# Patient Record
Sex: Male | Born: 1950 | Race: White | Hispanic: No | Marital: Married | Smoking: Former smoker
Health system: Southern US, Community
[De-identification: ages and names within clinical notes are randomized; demographics above are authoritative.]

## PROBLEM LIST (undated history)

## (undated) DIAGNOSIS — E039 Hypothyroidism, unspecified: Secondary | ICD-10-CM

## (undated) DIAGNOSIS — C61 Malignant neoplasm of prostate: Secondary | ICD-10-CM

## (undated) DIAGNOSIS — F329 Major depressive disorder, single episode, unspecified: Secondary | ICD-10-CM

## (undated) DIAGNOSIS — F32A Depression, unspecified: Secondary | ICD-10-CM

## (undated) DIAGNOSIS — F319 Bipolar disorder, unspecified: Secondary | ICD-10-CM

## (undated) HISTORY — DX: Hypothyroidism, unspecified: E03.9

## (undated) HISTORY — DX: Bipolar disorder, unspecified: F31.9

## (undated) HISTORY — PX: APPENDECTOMY: SHX54

## (undated) HISTORY — DX: Malignant neoplasm of prostate: C61

## (undated) HISTORY — DX: Major depressive disorder, single episode, unspecified: F32.9

## (undated) HISTORY — DX: Depression, unspecified: F32.A

## (undated) HISTORY — PX: TONSILLECTOMY: SUR1361

---

## 2005-02-02 DIAGNOSIS — C61 Malignant neoplasm of prostate: Secondary | ICD-10-CM

## 2005-02-02 HISTORY — PX: PROSTATECTOMY: SHX69

## 2005-02-02 HISTORY — DX: Malignant neoplasm of prostate: C61

## 2006-12-13 ENCOUNTER — Ambulatory Visit: Payer: Self-pay | Admitting: Internal Medicine

## 2006-12-13 LAB — CONVERTED CEMR LAB
ALT: 25 units/L (ref 0–53)
AST: 22 units/L (ref 0–37)
Albumin: 3.8 g/dL (ref 3.5–5.2)
Basophils Absolute: 0 10*3/uL (ref 0.0–0.1)
Calcium: 8.5 mg/dL (ref 8.4–10.5)
Chloride: 109 meq/L (ref 96–112)
Cholesterol: 152 mg/dL (ref 0–200)
Eosinophils Absolute: 0.2 10*3/uL (ref 0.0–0.6)
Eosinophils Relative: 3.4 % (ref 0.0–5.0)
GFR calc non Af Amer: 106 mL/min
Ketones, ur: NEGATIVE mg/dL
LDL Cholesterol: 93 mg/dL (ref 0–99)
MCHC: 33.7 g/dL (ref 30.0–36.0)
MCV: 95.1 fL (ref 78.0–100.0)
Nitrite: NEGATIVE
Platelets: 190 10*3/uL (ref 150–400)
RBC: 4.66 M/uL (ref 4.22–5.81)
Total CHOL/HDL Ratio: 3.1
Urine Glucose: NEGATIVE mg/dL
VLDL: 9 mg/dL (ref 0–40)
WBC: 4.5 10*3/uL (ref 4.5–10.5)

## 2006-12-17 ENCOUNTER — Ambulatory Visit: Payer: Self-pay | Admitting: Internal Medicine

## 2006-12-17 DIAGNOSIS — F319 Bipolar disorder, unspecified: Secondary | ICD-10-CM | POA: Insufficient documentation

## 2006-12-17 DIAGNOSIS — Z8546 Personal history of malignant neoplasm of prostate: Secondary | ICD-10-CM | POA: Insufficient documentation

## 2006-12-17 DIAGNOSIS — J309 Allergic rhinitis, unspecified: Secondary | ICD-10-CM | POA: Insufficient documentation

## 2006-12-17 DIAGNOSIS — F329 Major depressive disorder, single episode, unspecified: Secondary | ICD-10-CM | POA: Insufficient documentation

## 2006-12-20 ENCOUNTER — Encounter: Payer: Self-pay | Admitting: Internal Medicine

## 2007-07-04 HISTORY — PX: PENILE PROSTHESIS IMPLANT: SHX240

## 2007-07-18 ENCOUNTER — Encounter: Payer: Self-pay | Admitting: Internal Medicine

## 2008-02-20 ENCOUNTER — Ambulatory Visit: Payer: Self-pay | Admitting: Internal Medicine

## 2008-02-20 LAB — CONVERTED CEMR LAB
ALT: 31 units/L (ref 0–53)
Basophils Absolute: 0.1 10*3/uL (ref 0.0–0.1)
Bilirubin Urine: NEGATIVE
Bilirubin, Direct: 0.1 mg/dL (ref 0.0–0.3)
CO2: 32 meq/L (ref 19–32)
Calcium: 9 mg/dL (ref 8.4–10.5)
Eosinophils Absolute: 0.2 10*3/uL (ref 0.0–0.7)
GFR calc Af Amer: 111 mL/min
GFR calc non Af Amer: 92 mL/min
HDL: 62.1 mg/dL (ref >39.0)
Hemoglobin: 15.5 g/dL (ref 13.0–17.0)
LDL Cholesterol: 104 mg/dL — ABNORMAL HIGH (ref 0–99)
Leukocytes, UA: NEGATIVE
Lymphocytes Relative: 33.3 % (ref 12.0–46.0)
MCHC: 34.8 g/dL (ref 30.0–36.0)
MCV: 94.8 fL (ref 78.0–100.0)
Neutro Abs: 2.2 10*3/uL (ref 1.4–7.7)
RDW: 13.2 % (ref 11.5–14.6)
Sodium: 143 meq/L (ref 135–145)
TSH: 6.54 microintl units/mL — ABNORMAL HIGH (ref 0.35–5.50)
Total Bilirubin: 1.1 mg/dL (ref 0.3–1.2)
Total CHOL/HDL Ratio: 2.9
Triglycerides: 65 mg/dL (ref 0–149)
Urobilinogen, UA: 1 (ref 0.0–1.0)
pH: 6 (ref 5.0–8.0)

## 2008-02-22 ENCOUNTER — Ambulatory Visit: Payer: Self-pay | Admitting: Internal Medicine

## 2008-02-22 DIAGNOSIS — E039 Hypothyroidism, unspecified: Secondary | ICD-10-CM | POA: Insufficient documentation

## 2008-02-22 DIAGNOSIS — J069 Acute upper respiratory infection, unspecified: Secondary | ICD-10-CM | POA: Insufficient documentation

## 2008-04-24 ENCOUNTER — Ambulatory Visit: Payer: Self-pay | Admitting: Internal Medicine

## 2008-04-24 DIAGNOSIS — J019 Acute sinusitis, unspecified: Secondary | ICD-10-CM | POA: Insufficient documentation

## 2008-05-07 ENCOUNTER — Telehealth (INDEPENDENT_AMBULATORY_CARE_PROVIDER_SITE_OTHER): Payer: Self-pay | Admitting: *Deleted

## 2008-05-18 ENCOUNTER — Ambulatory Visit: Payer: Self-pay | Admitting: Emergency Medicine

## 2008-06-06 ENCOUNTER — Ambulatory Visit: Payer: Self-pay | Admitting: Internal Medicine

## 2008-06-13 ENCOUNTER — Encounter: Payer: Self-pay | Admitting: Internal Medicine

## 2008-06-14 ENCOUNTER — Ambulatory Visit: Payer: Self-pay | Admitting: Internal Medicine

## 2008-06-15 ENCOUNTER — Ambulatory Visit: Payer: Self-pay | Admitting: Internal Medicine

## 2008-06-22 ENCOUNTER — Ambulatory Visit: Payer: Self-pay | Admitting: Internal Medicine

## 2008-06-25 ENCOUNTER — Ambulatory Visit: Payer: Self-pay | Admitting: Internal Medicine

## 2008-06-28 ENCOUNTER — Ambulatory Visit: Payer: Self-pay | Admitting: Internal Medicine

## 2008-07-03 ENCOUNTER — Ambulatory Visit: Payer: Self-pay | Admitting: Internal Medicine

## 2008-07-09 ENCOUNTER — Ambulatory Visit: Payer: Self-pay | Admitting: Internal Medicine

## 2008-07-10 ENCOUNTER — Ambulatory Visit: Payer: Self-pay | Admitting: Internal Medicine

## 2008-07-13 ENCOUNTER — Ambulatory Visit: Payer: Self-pay | Admitting: Internal Medicine

## 2008-07-16 ENCOUNTER — Ambulatory Visit: Payer: Self-pay | Admitting: Internal Medicine

## 2008-07-20 ENCOUNTER — Ambulatory Visit: Payer: Self-pay | Admitting: Internal Medicine

## 2008-07-23 ENCOUNTER — Ambulatory Visit: Payer: Self-pay | Admitting: Internal Medicine

## 2008-07-27 ENCOUNTER — Ambulatory Visit: Payer: Self-pay | Admitting: Internal Medicine

## 2008-07-30 ENCOUNTER — Ambulatory Visit: Payer: Self-pay | Admitting: Internal Medicine

## 2008-08-03 ENCOUNTER — Ambulatory Visit: Payer: Self-pay | Admitting: Internal Medicine

## 2008-08-07 ENCOUNTER — Ambulatory Visit: Payer: Self-pay | Admitting: Internal Medicine

## 2008-08-10 ENCOUNTER — Ambulatory Visit: Payer: Self-pay | Admitting: Internal Medicine

## 2008-08-13 ENCOUNTER — Ambulatory Visit: Payer: Self-pay | Admitting: Internal Medicine

## 2008-08-14 ENCOUNTER — Ambulatory Visit: Payer: Self-pay | Admitting: Internal Medicine

## 2008-08-17 ENCOUNTER — Ambulatory Visit: Payer: Self-pay | Admitting: Internal Medicine

## 2008-08-20 ENCOUNTER — Ambulatory Visit: Payer: Self-pay | Admitting: Internal Medicine

## 2008-08-24 ENCOUNTER — Ambulatory Visit: Payer: Self-pay | Admitting: Internal Medicine

## 2008-08-27 ENCOUNTER — Ambulatory Visit: Payer: Self-pay | Admitting: Internal Medicine

## 2008-08-28 ENCOUNTER — Ambulatory Visit: Payer: Self-pay | Admitting: Internal Medicine

## 2008-08-31 ENCOUNTER — Ambulatory Visit: Payer: Self-pay | Admitting: Internal Medicine

## 2008-09-07 ENCOUNTER — Ambulatory Visit: Payer: Self-pay | Admitting: Internal Medicine

## 2008-09-10 ENCOUNTER — Ambulatory Visit: Payer: Self-pay | Admitting: Internal Medicine

## 2008-09-11 ENCOUNTER — Ambulatory Visit: Payer: Self-pay | Admitting: Internal Medicine

## 2008-09-14 ENCOUNTER — Ambulatory Visit: Payer: Self-pay | Admitting: Internal Medicine

## 2008-09-17 ENCOUNTER — Ambulatory Visit: Payer: Self-pay | Admitting: Internal Medicine

## 2008-09-21 ENCOUNTER — Ambulatory Visit: Payer: Self-pay | Admitting: Internal Medicine

## 2008-09-24 ENCOUNTER — Ambulatory Visit: Payer: Self-pay | Admitting: Internal Medicine

## 2008-09-28 ENCOUNTER — Ambulatory Visit: Payer: Self-pay | Admitting: Internal Medicine

## 2008-10-01 ENCOUNTER — Ambulatory Visit: Payer: Self-pay | Admitting: Internal Medicine

## 2008-10-05 ENCOUNTER — Ambulatory Visit: Payer: Self-pay | Admitting: Internal Medicine

## 2008-10-11 ENCOUNTER — Ambulatory Visit: Payer: Self-pay | Admitting: Internal Medicine

## 2008-10-17 ENCOUNTER — Ambulatory Visit: Payer: Self-pay | Admitting: Internal Medicine

## 2008-10-22 ENCOUNTER — Telehealth: Payer: Self-pay | Admitting: Internal Medicine

## 2008-10-23 ENCOUNTER — Ambulatory Visit: Payer: Self-pay | Admitting: Internal Medicine

## 2008-10-23 DIAGNOSIS — R21 Rash and other nonspecific skin eruption: Secondary | ICD-10-CM

## 2008-10-30 ENCOUNTER — Ambulatory Visit: Payer: Self-pay | Admitting: Internal Medicine

## 2008-11-06 ENCOUNTER — Ambulatory Visit: Payer: Self-pay | Admitting: Internal Medicine

## 2008-11-13 ENCOUNTER — Ambulatory Visit: Payer: Self-pay | Admitting: Internal Medicine

## 2008-11-20 ENCOUNTER — Ambulatory Visit: Payer: Self-pay | Admitting: Internal Medicine

## 2008-11-27 ENCOUNTER — Ambulatory Visit: Payer: Self-pay | Admitting: Internal Medicine

## 2008-12-11 ENCOUNTER — Ambulatory Visit: Payer: Self-pay | Admitting: Internal Medicine

## 2008-12-18 ENCOUNTER — Ambulatory Visit: Payer: Self-pay | Admitting: Internal Medicine

## 2008-12-19 ENCOUNTER — Ambulatory Visit: Payer: Self-pay | Admitting: Internal Medicine

## 2008-12-28 ENCOUNTER — Ambulatory Visit: Payer: Self-pay | Admitting: Internal Medicine

## 2009-01-02 ENCOUNTER — Ambulatory Visit: Payer: Self-pay | Admitting: Internal Medicine

## 2009-01-08 ENCOUNTER — Ambulatory Visit: Payer: Self-pay | Admitting: Internal Medicine

## 2009-01-16 ENCOUNTER — Ambulatory Visit: Payer: Self-pay | Admitting: Internal Medicine

## 2009-01-22 ENCOUNTER — Ambulatory Visit: Payer: Self-pay | Admitting: Internal Medicine

## 2009-01-30 ENCOUNTER — Ambulatory Visit: Payer: Self-pay | Admitting: Internal Medicine

## 2009-02-07 ENCOUNTER — Ambulatory Visit: Payer: Self-pay | Admitting: Internal Medicine

## 2009-02-15 ENCOUNTER — Ambulatory Visit: Payer: Self-pay | Admitting: Internal Medicine

## 2009-02-16 LAB — CONVERTED CEMR LAB
ALT: 22 units/L (ref 0–53)
AST: 20 units/L (ref 0–37)
Albumin: 4.5 g/dL (ref 3.5–5.2)
Basophils Relative: 2.9 % (ref 0.0–3.0)
Bilirubin, Direct: 0.2 mg/dL (ref 0.0–0.3)
CO2: 18 meq/L — ABNORMAL LOW (ref 19–32)
Calcium: 8.2 mg/dL — ABNORMAL LOW (ref 8.4–10.5)
Cholesterol: 145 mg/dL (ref 0–200)
Eosinophils Relative: 5.4 % — ABNORMAL HIGH (ref 0.0–5.0)
Glucose, Bld: 87 mg/dL (ref 70–99)
HCT: 47 % (ref 39.0–52.0)
HDL: 49 mg/dL (ref >39)
Hemoglobin: 15.4 g/dL (ref 13.0–17.0)
Lymphocytes Relative: 36.1 % (ref 12.0–46.0)
Lymphs Abs: 1.8 10*3/uL (ref 0.7–4.0)
Monocytes Relative: 11.6 % (ref 3.0–12.0)
Neutro Abs: 2.2 10*3/uL (ref 1.4–7.7)
Nitrite: NEGATIVE
Potassium: 4 meq/L (ref 3.5–5.3)
RBC: 4.75 M/uL (ref 4.22–5.81)
Sodium: 145 meq/L (ref 135–145)
Specific Gravity, Urine: 1.025 (ref 1.000–1.030)
Total CHOL/HDL Ratio: 3
Total Protein, Urine: NEGATIVE mg/dL
Urine Glucose: NEGATIVE mg/dL
Urobilinogen, UA: 0.2 (ref 0.0–1.0)
VLDL: 22 mg/dL (ref 0–40)
WBC: 5 10*3/uL (ref 4.5–10.5)

## 2009-02-19 ENCOUNTER — Ambulatory Visit: Payer: Self-pay | Admitting: Internal Medicine

## 2009-02-22 ENCOUNTER — Ambulatory Visit: Payer: Self-pay | Admitting: Internal Medicine

## 2009-02-28 ENCOUNTER — Ambulatory Visit: Payer: Self-pay | Admitting: Internal Medicine

## 2009-03-05 ENCOUNTER — Encounter: Payer: Self-pay | Admitting: Internal Medicine

## 2009-03-08 ENCOUNTER — Ambulatory Visit: Payer: Self-pay | Admitting: Internal Medicine

## 2009-03-15 ENCOUNTER — Ambulatory Visit: Payer: Self-pay | Admitting: Internal Medicine

## 2009-03-22 ENCOUNTER — Ambulatory Visit: Payer: Self-pay | Admitting: Internal Medicine

## 2009-03-29 ENCOUNTER — Ambulatory Visit: Payer: Self-pay | Admitting: Internal Medicine

## 2009-04-01 ENCOUNTER — Telehealth: Payer: Self-pay | Admitting: Internal Medicine

## 2009-04-05 ENCOUNTER — Ambulatory Visit: Payer: Self-pay | Admitting: Internal Medicine

## 2009-04-11 ENCOUNTER — Ambulatory Visit: Payer: Self-pay | Admitting: Internal Medicine

## 2009-04-12 ENCOUNTER — Ambulatory Visit: Payer: Self-pay | Admitting: Internal Medicine

## 2009-04-19 ENCOUNTER — Ambulatory Visit: Payer: Self-pay | Admitting: Internal Medicine

## 2009-04-25 ENCOUNTER — Ambulatory Visit: Payer: Self-pay | Admitting: Internal Medicine

## 2009-05-03 ENCOUNTER — Ambulatory Visit: Payer: Self-pay | Admitting: Internal Medicine

## 2009-05-10 ENCOUNTER — Ambulatory Visit: Payer: Self-pay | Admitting: Internal Medicine

## 2009-05-17 ENCOUNTER — Ambulatory Visit: Payer: Self-pay | Admitting: Internal Medicine

## 2009-05-23 ENCOUNTER — Ambulatory Visit: Payer: Self-pay | Admitting: Internal Medicine

## 2009-05-31 ENCOUNTER — Ambulatory Visit: Payer: Self-pay | Admitting: Internal Medicine

## 2009-06-14 ENCOUNTER — Ambulatory Visit: Payer: Self-pay | Admitting: Internal Medicine

## 2009-06-21 ENCOUNTER — Ambulatory Visit: Payer: Self-pay | Admitting: Internal Medicine

## 2009-06-27 ENCOUNTER — Ambulatory Visit: Payer: Self-pay | Admitting: Internal Medicine

## 2009-07-05 ENCOUNTER — Ambulatory Visit: Payer: Self-pay | Admitting: Internal Medicine

## 2009-07-12 ENCOUNTER — Ambulatory Visit: Payer: Self-pay | Admitting: Internal Medicine

## 2009-07-19 ENCOUNTER — Ambulatory Visit: Payer: Self-pay | Admitting: Internal Medicine

## 2009-07-26 ENCOUNTER — Ambulatory Visit: Payer: Self-pay | Admitting: Internal Medicine

## 2009-08-02 ENCOUNTER — Ambulatory Visit: Payer: Self-pay | Admitting: Internal Medicine

## 2009-08-09 ENCOUNTER — Ambulatory Visit: Payer: Self-pay | Admitting: Internal Medicine

## 2009-08-16 ENCOUNTER — Ambulatory Visit: Payer: Self-pay | Admitting: Internal Medicine

## 2009-08-23 ENCOUNTER — Ambulatory Visit: Payer: Self-pay | Admitting: Internal Medicine

## 2009-08-28 ENCOUNTER — Telehealth: Payer: Self-pay | Admitting: Internal Medicine

## 2009-08-30 ENCOUNTER — Ambulatory Visit: Payer: Self-pay | Admitting: Internal Medicine

## 2009-09-06 ENCOUNTER — Ambulatory Visit: Payer: Self-pay | Admitting: Internal Medicine

## 2009-09-10 ENCOUNTER — Ambulatory Visit: Payer: Self-pay | Admitting: Internal Medicine

## 2009-09-13 ENCOUNTER — Ambulatory Visit: Payer: Self-pay | Admitting: Internal Medicine

## 2009-09-20 ENCOUNTER — Ambulatory Visit: Payer: Self-pay | Admitting: Internal Medicine

## 2009-09-27 ENCOUNTER — Ambulatory Visit: Payer: Self-pay | Admitting: Internal Medicine

## 2009-10-04 ENCOUNTER — Ambulatory Visit: Payer: Self-pay | Admitting: Internal Medicine

## 2009-10-11 ENCOUNTER — Ambulatory Visit: Payer: Self-pay | Admitting: Internal Medicine

## 2009-10-18 ENCOUNTER — Ambulatory Visit: Payer: Self-pay | Admitting: Internal Medicine

## 2009-10-25 ENCOUNTER — Ambulatory Visit: Payer: Self-pay | Admitting: Internal Medicine

## 2009-11-01 ENCOUNTER — Ambulatory Visit: Payer: Self-pay | Admitting: Internal Medicine

## 2009-11-08 ENCOUNTER — Ambulatory Visit: Payer: Self-pay | Admitting: Internal Medicine

## 2009-11-15 ENCOUNTER — Ambulatory Visit: Payer: Self-pay | Admitting: Internal Medicine

## 2009-11-22 ENCOUNTER — Ambulatory Visit: Payer: Self-pay | Admitting: Internal Medicine

## 2009-11-29 ENCOUNTER — Ambulatory Visit: Payer: Self-pay | Admitting: Internal Medicine

## 2009-12-06 ENCOUNTER — Ambulatory Visit: Payer: Self-pay | Admitting: Internal Medicine

## 2009-12-13 ENCOUNTER — Ambulatory Visit: Payer: Self-pay | Admitting: Internal Medicine

## 2009-12-20 ENCOUNTER — Ambulatory Visit: Payer: Self-pay | Admitting: Internal Medicine

## 2009-12-27 ENCOUNTER — Ambulatory Visit: Payer: Self-pay | Admitting: Internal Medicine

## 2010-01-02 ENCOUNTER — Ambulatory Visit: Payer: Self-pay | Admitting: Internal Medicine

## 2010-01-10 ENCOUNTER — Ambulatory Visit: Payer: Self-pay | Admitting: Internal Medicine

## 2010-01-17 ENCOUNTER — Ambulatory Visit: Payer: Self-pay | Admitting: Internal Medicine

## 2010-01-28 ENCOUNTER — Ambulatory Visit: Payer: Self-pay | Admitting: Internal Medicine

## 2010-01-31 ENCOUNTER — Ambulatory Visit: Payer: Self-pay | Admitting: Internal Medicine

## 2010-02-04 ENCOUNTER — Ambulatory Visit: Payer: Self-pay | Admitting: Internal Medicine

## 2010-02-17 ENCOUNTER — Ambulatory Visit: Payer: Self-pay | Admitting: Internal Medicine

## 2010-02-21 ENCOUNTER — Ambulatory Visit: Payer: Self-pay | Admitting: Internal Medicine

## 2010-02-27 ENCOUNTER — Ambulatory Visit: Payer: Self-pay | Admitting: Internal Medicine

## 2010-03-02 ENCOUNTER — Ambulatory Visit: Payer: Self-pay | Admitting: Internal Medicine

## 2010-03-06 NOTE — Assessment & Plan Note (Signed)
Summary: Thomas Carroll   Primary Aireona Torelli/Referring Casin Federici:  Dr. Jonny Ruiz  CC:  Follow up visit-allergies.  History of Present Illness: 08/28/08- Allergic rhinitis Scheduled return midway through allergy vaccine build up based on skin testing last visit. So far no problems or concerns with the shots. Expects some head congestion with Fall leaves. We discussed decongestants and distinguished from antihistamines. He denies headache, prulent or bloody discharge, chest tightness or wheeze. Discussed and recommended flu vax in season, and pneumovax now, since he has never had either and will be going to [place] in October.  February 28, 2009- Allergic rhinitis Good trip to [place] without respiratory problems on that trip. Recognizes effect of dust exposure in his home with mild persistent nasal congestion. He says he is much better since taking allergy shots. Now at maintenance. Gets allergy injections here at 1:50 with no problems.  September 13, 2009- Allergic rhinitis Worst season is household dust/ winter. He feels overall he is doing much better now thanks to allergy vaccine- Now at 1:10. He has the information on dust mite control. He gets some nasal congestion and drainage, but much less than before    Preventive Screening-Counseling & Management  Alcohol-Tobacco     Smoking Status: quit     Year Quit: 04-09-1981  Current Medications (verified): 1)  Lamictal 100 Mg  Tabs (Lamotrigine) .Marland Kitchen.. 1 and 1/2 Tab By Mouth Qam and 2 By Mouth Qhs 2)  Trazodone Hcl 100 Mg  Tabs (Trazodone Hcl) .... Take 2 By Mouth Q Pm 3)  Levothyroxine Sodium 25 Mcg Tabs (Levothyroxine Sodium) .Marland Kitchen.. 1po Once Daily 4)  Adult Aspirin Ec Low Strength 81 Mg Tbec (Aspirin) .Marland Kitchen.. 1po Once Daily 5)  Allergy Vaccine Gh .... Advancing Now To 1:10  Allergies (verified): No Known Drug Allergies  Past History:  Past Medical History: Last updated: 06/06/2008 Prostate cancer, hx of - 2007  Depression Allergic rhinitis - Skin test POS  06/06/08  Bipolar Hypothyroidism  Past Surgical History: Last updated: 02/22/2008 s/p prostatectomy 2007 Tonsillectomy Appendectomy s/p penile implant 6/09 - [facility] - [facility]rology  Family History: Last updated: 06/06/2008 Family History Lung cancer - father deceased Mother with liver cancer deceased father with probable bipolar  Social History: Last updated: 02/22/2008 Former Smoker Alcohol use-yes Divorced 1 son - 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    Vital Signs:  Patient profile:   60 year old male Height:      71 inches Weight:      190.13 pounds BMI:     26.61 O2 Sat:      95 % on Room air Pulse rate:   86 / minute BP sitting:   122 / 78  (right arm) Cuff size:   regular  Vitals Entered By: Reynaldo Minium CMA (September 13, 2009 11:28 AM)  O2 Flow:  Room air CC: Follow up visit-allergies   Physical Exam  Additional Exam:  General: A/Ox3; pleasant and cooperative, NAD, very comfortable appearing SKIN: no rash, lesions NODES: no lymphadenopathy HEENT: [place]/AT, EOM- WNL, Conjuctivae- clear, PERRLA, periorbital edema, mild conjunctival injection, TM-WNL, Nose- clear, Throat- red, no  stridor, voice normal, Mallampti III NECK: Supple w/ fair ROM, JVD- none, normal carotid impulses w/o bruits CHEST: Clear to P&A HEART: RRR, no m/g/r heard ABDOMEN: scaphoid JXB:JYNW, nl pulses, no edema  NEURO: Grossly intact to observation      Impression & Recommendations:  Problem # 1:  ALLERGIC  RHINITIS (ICD-477.9)  Doing verywell and satisfied at this point with allergy vaccine. He has been educated on dust control mneasures. He knows to take antihistmamines if needed.l  Other Orders: Est. Patient Level III (29562)  Patient Instructions: 1)  Please schedule a follow-up appointment in 1 year. 2)  Continue allergy vaccine 3)  Call sooner if needed

## 2010-03-06 NOTE — Progress Notes (Signed)
Summary: ABX refill?  Phone Note Call from Patient Call back at [facility] Phone [phone]   Caller: Patient Summary of Call: pt called requesting refill of ABX stating that swollen glands in neck have returned. I advised pt that he would need an OV but he requested I ask MD stating that this was something that was discussed at OV (possible return of infection) and that he was here only 1 month ago. please advise Initial call taken by: Margaret Pyle, CMA,  April 01, 2009 11:44 AM  Follow-up for Phone Call        this is a new problem, and per office policy we do not simply call in antibx;  consider OV or try mucinex otc for congesiton, tylenol for pain  Follow-up by: Corwin Levins MD,  April 01, 2009 12:12 PM  Additional Follow-up for Phone Call Additional follow up Details #1::        I called pt to inform of above and was interupted by pt who stated " I get a sinus infection 4-5 a winter, so If I need an antibiotic I need to come back in for the same F---ing thing!" The pt then hung up. Additional Follow-up by: Margaret Pyle, CMA,  April 01, 2009 1:30 PM

## 2010-03-06 NOTE — Miscellaneous (Signed)
Summary: Injection Orders / [facility]    Injection Orders / [facility]    Imported By: Lennie Odor 07/02/2009 14:31:50  _____________________________________________________________________  External Attachment:    Type:   Image     Comment:   External Document

## 2010-03-06 NOTE — Miscellaneous (Signed)
Summary: Injection Record/[facility]  Injection Record/[facility]   Imported By: Sherian Rein 07/25/2009 08:16:32  _____________________________________________________________________  External Attachment:    Type:   Image     Comment:   External Document

## 2010-03-06 NOTE — Miscellaneous (Signed)
Summary: Injection Record / [facility]    Injection Record / [facility]    Imported By: Lennie Odor 10/04/2009 09:55:11  _____________________________________________________________________  External Attachment:    Type:   Image     Comment:   External Document

## 2010-03-06 NOTE — Miscellaneous (Signed)
Summary: Injection record  Injection record   Imported By: Lester Mohall 02/14/2010 12:15:40  _____________________________________________________________________  External Attachment:    Type:   Image     Comment:   External Document

## 2010-03-06 NOTE — Miscellaneous (Signed)
Summary: Injection Record/[facility]  Injection Record/[facility]   Imported By: Sherian Rein 06/25/2009 08:46:58  _____________________________________________________________________  External Attachment:    Type:   Image     Comment:   External Document

## 2010-03-06 NOTE — Progress Notes (Signed)
Summary: nos appt  Phone Note Call from Patient   Caller: [email or web address]  Call For: Dicky Boer Summary of Call: Rsc nos from 7/26 to 7/29 @ 1:30p. Initial call taken by: Darletta Moll,  August 28, 2009 9:53 AM

## 2010-03-06 NOTE — Assessment & Plan Note (Signed)
Summary: 1 mos f/u #/cd   Vital Signs:  Patient profile:   60 year old male Height:      70 inches Weight:      191 pounds BMI:     27.50 O2 Sat:      95 % on Room air Temp:     98.1 degrees F oral Pulse rate:   90 / minute BP sitting:   122 / 86  (left arm) Cuff size:   regular  Vitals Entered ByZella Ball Ewing (February 19, 2009 2:00 PM)  O2 Flow:  Room air  CC: yearly/RE   Primary Care Provider:  Dr. Jonny Ruiz  CC:  yearly/RE.  History of Present Illness: overall doing well;  incidently with 2 days onset severe ST iwth fever, but no cough, wheezing and Pt denies CP, sob, doe, wheezing, orthopnea, pnd, worsening LE edema, palps, dizziness or syncope   Problems Prior to Update: 1)  Pharyngitis-acute  (ICD-462) 2)  Sinusitis- Acute-nos  (ICD-461.9) 3)  Rash-nonvesicular  (ICD-782.1) 4)  Sinusitis- Acute-nos  (ICD-461.9) 5)  Hypothyroidism  (ICD-244.9) 6)  Uri  (ICD-465.9) 7)  Preventive Health Care  (ICD-V70.0) 8)  Preventive Health Care  (ICD-V70.0) 9)  Bipolar Affective Disorder  (ICD-296.80) 10)  Allergic Rhinitis  (ICD-477.9) 11)  Depression  (ICD-311) 12)  Prostate Cancer, Hx of  (ICD-V10.46) 13)  Special Screening Malignant Neoplasm of Prostate  (ICD-V76.44) 14)  Routine General Medical Exam@health  Care Fac[facility]  (ICD-V70.0)  Medications Prior to Update: 1)  Lamictal 100 Mg  Tabs (Lamotrigine) .Marland Kitchen.. 1 and 1/2 Tab By Mouth Qam and 2 By Mouth Qhs 2)  Trazodone Hcl 100 Mg  Tabs (Trazodone Hcl) .... Take 2 By Mouth Q Pm 3)  Levothyroxine Sodium 25 Mcg Tabs (Levothyroxine Sodium) .Marland Kitchen.. 1po Once Daily 4)  Adult Aspirin Ec Low Strength 81 Mg Tbec (Aspirin) .Marland Kitchen.. 1po Once Daily 5)  Allergy Vaccine .... As Directed 6)  Prednisone 10 Mg Tabs (Prednisone) .... 3po Qd For 3days, Then 2po Qd For 3days, Then 1po Qd For 3days, Then Stop 7)  Triamcinolone Acetonide 0.5 % Crea (Triamcinolone Acetonide) .... Use Asd Two Times A Day As Needed 8)  Clarithromycin 500 Mg Tabs (Clarithromycin)  .Marland Kitchen.. 1 By Mouth Two Times A Day 9)  Hydrocodone-Homatropine 5-1.5 Mg/62ml Syrp (Hydrocodone-Homatropine) .Marland Kitchen.. 1 Tsp By Mouth  Q 6 Hrs As Needed  Current Medications (verified): 1)  Lamictal 100 Mg  Tabs (Lamotrigine) .Marland Kitchen.. 1 and 1/2 Tab By Mouth Qam and 2 By Mouth Qhs 2)  Trazodone Hcl 100 Mg  Tabs (Trazodone Hcl) .... Take 2 By Mouth Q Pm 3)  Levothyroxine Sodium 25 Mcg Tabs (Levothyroxine Sodium) .Marland Kitchen.. 1po Once Daily 4)  Adult Aspirin Ec Low Strength 81 Mg Tbec (Aspirin) .Marland Kitchen.. 1po Once Daily 5)  Allergy Vaccine .... As Directed 6)  Clarithromycin 500 Mg Tabs (Clarithromycin) .Marland Kitchen.. 1 By Mouth Two Times A Day  Allergies (verified): No Known Drug Allergies  Past History:  Past Medical History: Last updated: 06/06/2008 Prostate cancer, hx of - 2007  Depression Allergic rhinitis - Skin test POS 06/06/08  Bipolar Hypothyroidism  Past Surgical History: Last updated: 02/22/2008 s/p prostatectomy 2007 Tonsillectomy Appendectomy s/p penile implant 6/09 - [facility] - urology  Family History: Last updated: 06/06/2008 Family History Lung cancer - father deceased Mother with liver cancer deceased father with probable bipolar  Social History: Last updated: 02/22/2008 Former Smoker Alcohol use-yes Divorced 1 son - 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         all otherwise negative per pt  Physical Exam  General:  alert and well-developed.   Head:   normocephalic and atraumatic.   Eyes:  vision grossly intact, pupils equal, and pupils round.   Ears:  R ear normal and L ear normal.   Nose:  no external deformity and no nasal discharge.   Mouth:  pharyngeal erythema and pharyngeal exudate.   Neck:  supple and cervical lymphadenopathy.   Lungs:  normal respiratory effort and normal breath sounds.   Heart:  normal rate and regular rhythm.   Abdomen:  soft, non-tender, and normal bowel sounds.   Msk:  no joint tenderness and no joint swelling.   Extremities:  no edema, no erythema  Neurologic:  cranial nerves II-XII intact and strength normal in all extremities.     Impression & Recommendations:  Problem # 1:  Preventive Health Care (ICD-V70.0) Overall doing well, updated the age appropriate counseling and education;  routine health screening/prevention reviewed and done as appropriate unless declined, immunizations up to date or declined, diet counseling done if overweight, urged to quit smoking if smokes , most recent labs reviewed and current ordered if appropriate, ecg reviewed or declined  Orders: EKG w/ Interpretation (93000)  Problem # 2:  PHARYNGITIS-ACUTE (ICD-462)  His updated medication list for this problem includes:    Adult Aspirin Ec Low Strength 81 Mg Tbec (Aspirin) .Marland Kitchen... 1po once daily    Clarithromycin 500 Mg Tabs (Clarithromycin) .Marland Kitchen... 1 by mouth two times a day treat as above, f/u any worsening signs or symptoms   Complete Medication List: 1)  Lamictal 100 Mg Tabs (Lamotrigine) .Marland Kitchen.. 1 and 1/2 tab by mouth qam and 2 by mouth qhs 2)  Trazodone Hcl 100 Mg Tabs (Trazodone hcl) .... Take 2 by mouth q pm 3)  Levothyroxine Sodium 25 Mcg Tabs (Levothyroxine sodium) .Marland Kitchen.. 1po once daily 4)  Adult Aspirin Ec Low Strength 81 Mg Tbec (Aspirin) .Marland Kitchen.. 1po once daily 5)  Allergy Vaccine  .... As directed 6)  Clarithromycin 500 Mg Tabs (Clarithromycin) .Marland Kitchen.. 1 by mouth two times a day  Patient Instructions: 1)  Please take all  new medications as prescribed 2)  Continue all previous medications as before this visit  3)  Please schedule a follow-up appointment in 1 year or sooner if needed for Yearly Exam Prescriptions: CLARITHROMYCIN 500 MG TABS (CLARITHROMYCIN) 1 by mouth two times a day  #20 x 0   Entered and Authorized by:   Corwin Levins MD   Signed by:   Corwin Levins MD on 02/19/2009   Method used:   Electronically to        [facility] [address] (retail)       [address]       [place], [place]  [address]       Ph: [phone] or [phone]       Fax: [phone]   RxID:   [id number]

## 2010-03-06 NOTE — Assessment & Plan Note (Signed)
Summary: 6 months/ mbw   Primary Provider/Referring Provider:  Dr. Jonny Ruiz  CC:  6 month follow up   and pt states allergies are better but still has post nasal drainage .  History of Present Illness:  06/06/08- Allergic rhinitis Referred by Dr Delton Coombes specifically for allergy testing. Patient with hx positive inhalant allergies/ skin testing/ vaccine by Dr Stevphen [place] Onset around age 60. Doing better around age 75 and went off the shots. He says vaccine helped with no problems. Has been off allergy vaccine for years. Rhinits without asthma gradually returning  Now almost perennial, a little milder in the winter. The past year has been much worse- head congestion and pressure, watery rhinorhea, sneezing. Little or no asthma/ wheeze/ chest. Not aware of problems with foods, insect stings or aspirin and no hx nasal polyps.  Skin test:Pos Especially dust/ mite; cockroach.  08/28/08- Allergic rhinitis Scheduled return midway through allergy vaccine build up based on skin testing last visit. So far no problems or concerns with the shots. Expects some head congestion with Fall leaves. We discussed decongestants and distinguished from antihistamines. He denies headache, prulent or bloody discharge, chest tightness or wheeze. Discussed and recommended flu vax in season, and pneumovax now, since he has never had either and will be going to [place] in October.  February 28, 2009- Allergic rhinitis Good trip to [place] without respiratory problems on that trip. Recognizes effect of dust exposure in his home with mild persistent nasal congestion. He says he is much better since taking allergy shots. Now at maintenance. Gets allergy injections here at 1:50 with no problems.   Current Medications (verified): 1)  Lamictal 100 Mg  Tabs (Lamotrigine) .Marland Kitchen.. 1 and 1/2 Tab By Mouth Qam and 2 By Mouth Qhs 2)  Trazodone Hcl 100 Mg  Tabs (Trazodone Hcl) .... Take 2 By Mouth Q Pm 3)  Levothyroxine Sodium 25 Mcg Tabs  (Levothyroxine Sodium) .Marland Kitchen.. 1po Once Daily 4)  Adult Aspirin Ec Low Strength 81 Mg Tbec (Aspirin) .Marland Kitchen.. 1po Once Daily 5)  Allergy Vaccine .... As Directed 6)  Clarithromycin 500 Mg Tabs (Clarithromycin) .Marland Kitchen.. 1 By Mouth Two Times A Day  Allergies (verified): No Known Drug Allergies  Past History:  Past Medical History: Last updated: 06/06/2008 Prostate cancer, hx of - 2007  Depression Allergic rhinitis - Skin test POS 06/06/08  Bipolar Hypothyroidism  Past Surgical History: Last updated: 02/22/2008 s/p prostatectomy 2007 Tonsillectomy Appendectomy s/p penile implant 6/09 - [facility] - [facility]rology  Family History: Last updated: 06/06/2008 Family History Lung cancer - father deceased Mother with liver cancer deceased father with probable bipolar  Social History: Last updated: 02/22/2008 Former Smoker Alcohol use-yes 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    Vital Signs:  Patient profile:   60 year old male Height:      71 inches Weight:      192.8 pounds O2 Sat:      95 % on Room air Pulse rate:   81 / minute BP sitting:   120 / 82  (left arm)  O2 Sat at Rest %:  95% O2 Flow:  Room air CC: 6 month follow up  , pt  states allergies are better but still has post nasal drainage  Comments Medications reviewed with patient Renold Genta RCP, LPN  February 28, 2009 2:44 PM    Physical Exam  Additional Exam:  General: A/Ox3; pleasant and cooperative, NAD, very comfortable appearing SKIN: no rash, lesions NODES: no lymphadenopathy HEENT: [place]/AT, EOM- WNL, Conjuctivae- clear, PERRLA, periorbital edema, mild  conjunctival injection, TM-WNL, Nose- clear, Throat- red, no stridor, voice normal, Melampatti III NECK: Supple w/ fair ROM, JVD- none, normal carotid impulses w/o bruits CHEST: Clear to P&A HEART: RRR, no m/g/r heard ABDOMEN: scaphoid ZOX:WRUE, nl pulses, no edema  NEURO: Grossly intact to observation      Impression & Recommendations:  Problem # 1:  ALLERGIC RHINITIS (ICD-477.9)  Fairly good control. He mentions mild drainage I dont see at this time. We will move his vaccine up to 1:10 as discussed.  Medications Added to Medication List This Visit: 1)  Allergy Vaccine Gh  .... Advancing now to 1:10  Other Orders: Est. Patient Level III (45409)  Patient Instructions: 1)  Please schedule a follow-up appointment in 6 months. 2)  We will be moving up the strength of yuour allergy vaccine to 1:10 next time vaccine is ordered. You will continue at once per week and the allergy lab will make the adjustments as needed.

## 2010-03-06 NOTE — Miscellaneous (Signed)
Summary: Injection Orders / [facility]    Injection Orders / [facility]    Imported By: Lennie Odor 07/02/2009 16:02:57  _____________________________________________________________________  External Attachment:    Type:   Image     Comment:   External Document

## 2010-03-07 DIAGNOSIS — J301 Allergic rhinitis due to pollen: Secondary | ICD-10-CM

## 2010-03-14 DIAGNOSIS — J301 Allergic rhinitis due to pollen: Secondary | ICD-10-CM

## 2010-03-21 ENCOUNTER — Ambulatory Visit (INDEPENDENT_AMBULATORY_CARE_PROVIDER_SITE_OTHER): Payer: BC Managed Care – PPO

## 2010-03-21 ENCOUNTER — Encounter: Payer: Self-pay | Admitting: Internal Medicine

## 2010-03-21 DIAGNOSIS — J301 Allergic rhinitis due to pollen: Secondary | ICD-10-CM

## 2010-03-28 ENCOUNTER — Ambulatory Visit (INDEPENDENT_AMBULATORY_CARE_PROVIDER_SITE_OTHER): Payer: BC Managed Care – PPO

## 2010-03-28 DIAGNOSIS — J301 Allergic rhinitis due to pollen: Secondary | ICD-10-CM

## 2010-04-04 ENCOUNTER — Encounter: Payer: Self-pay | Admitting: Internal Medicine

## 2010-04-04 ENCOUNTER — Ambulatory Visit (INDEPENDENT_AMBULATORY_CARE_PROVIDER_SITE_OTHER): Payer: BC Managed Care – PPO

## 2010-04-04 DIAGNOSIS — J301 Allergic rhinitis due to pollen: Secondary | ICD-10-CM

## 2010-04-10 NOTE — Assessment & Plan Note (Signed)
Summary: ALLERGY/CB   Nurse Visit   Allergies: No Known Drug Allergies  Orders Added: 1)  Allergy Injection (1) [95115] 

## 2010-04-11 ENCOUNTER — Encounter: Payer: Self-pay | Admitting: Internal Medicine

## 2010-04-11 ENCOUNTER — Ambulatory Visit (INDEPENDENT_AMBULATORY_CARE_PROVIDER_SITE_OTHER): Payer: BC Managed Care – PPO

## 2010-04-11 DIAGNOSIS — J301 Allergic rhinitis due to pollen: Secondary | ICD-10-CM

## 2010-04-15 NOTE — Assessment & Plan Note (Signed)
Summary: ALLERGY/CB   Nurse Visit   Allergies: No Known Drug Allergies  Orders Added: 1)  Allergy Injection (1) [95115] 

## 2010-04-18 ENCOUNTER — Encounter: Payer: Self-pay | Admitting: Internal Medicine

## 2010-04-18 ENCOUNTER — Ambulatory Visit (INDEPENDENT_AMBULATORY_CARE_PROVIDER_SITE_OTHER): Payer: BC Managed Care – PPO

## 2010-04-18 DIAGNOSIS — J301 Allergic rhinitis due to pollen: Secondary | ICD-10-CM

## 2010-04-22 NOTE — Assessment & Plan Note (Signed)
Summary: allergy/cb  Nurse Visit   Allergies: No Known Drug Allergies  Orders Added: 1)  Allergy Injection (1) [95115] 

## 2010-04-22 NOTE — Miscellaneous (Signed)
Summary: Injection [facility]   Imported By: Sherian Rein 04/14/2010 14:25:03  _____________________________________________________________________  External Attachment:    Type:   Image     Comment:   External Document

## 2010-04-25 ENCOUNTER — Ambulatory Visit (INDEPENDENT_AMBULATORY_CARE_PROVIDER_SITE_OTHER): Payer: BC Managed Care – PPO

## 2010-04-25 DIAGNOSIS — J301 Allergic rhinitis due to pollen: Secondary | ICD-10-CM

## 2010-05-02 ENCOUNTER — Ambulatory Visit (INDEPENDENT_AMBULATORY_CARE_PROVIDER_SITE_OTHER): Payer: BC Managed Care – PPO

## 2010-05-02 DIAGNOSIS — J301 Allergic rhinitis due to pollen: Secondary | ICD-10-CM

## 2010-05-05 ENCOUNTER — Ambulatory Visit (INDEPENDENT_AMBULATORY_CARE_PROVIDER_SITE_OTHER): Payer: BC Managed Care – PPO

## 2010-05-05 DIAGNOSIS — J301 Allergic rhinitis due to pollen: Secondary | ICD-10-CM

## 2010-05-08 ENCOUNTER — Ambulatory Visit (INDEPENDENT_AMBULATORY_CARE_PROVIDER_SITE_OTHER): Payer: BC Managed Care – PPO

## 2010-05-08 DIAGNOSIS — J301 Allergic rhinitis due to pollen: Secondary | ICD-10-CM

## 2010-05-16 ENCOUNTER — Telehealth: Payer: Self-pay | Admitting: Internal Medicine

## 2010-05-16 ENCOUNTER — Ambulatory Visit (INDEPENDENT_AMBULATORY_CARE_PROVIDER_SITE_OTHER): Payer: Self-pay

## 2010-05-16 DIAGNOSIS — J309 Allergic rhinitis, unspecified: Secondary | ICD-10-CM

## 2010-05-21 NOTE — Telephone Encounter (Signed)
ERROR

## 2010-05-23 ENCOUNTER — Ambulatory Visit (INDEPENDENT_AMBULATORY_CARE_PROVIDER_SITE_OTHER): Payer: Self-pay

## 2010-05-23 DIAGNOSIS — J309 Allergic rhinitis, unspecified: Secondary | ICD-10-CM

## 2010-05-30 ENCOUNTER — Ambulatory Visit (INDEPENDENT_AMBULATORY_CARE_PROVIDER_SITE_OTHER): Payer: Self-pay

## 2010-05-30 DIAGNOSIS — J309 Allergic rhinitis, unspecified: Secondary | ICD-10-CM

## 2010-06-06 ENCOUNTER — Ambulatory Visit (INDEPENDENT_AMBULATORY_CARE_PROVIDER_SITE_OTHER): Payer: Self-pay

## 2010-06-06 DIAGNOSIS — J309 Allergic rhinitis, unspecified: Secondary | ICD-10-CM

## 2010-06-13 ENCOUNTER — Ambulatory Visit (INDEPENDENT_AMBULATORY_CARE_PROVIDER_SITE_OTHER): Payer: Self-pay

## 2010-06-13 DIAGNOSIS — J309 Allergic rhinitis, unspecified: Secondary | ICD-10-CM

## 2010-06-20 ENCOUNTER — Ambulatory Visit (INDEPENDENT_AMBULATORY_CARE_PROVIDER_SITE_OTHER): Payer: Self-pay

## 2010-06-20 DIAGNOSIS — J309 Allergic rhinitis, unspecified: Secondary | ICD-10-CM

## 2010-06-27 ENCOUNTER — Ambulatory Visit (INDEPENDENT_AMBULATORY_CARE_PROVIDER_SITE_OTHER): Payer: Self-pay

## 2010-06-27 DIAGNOSIS — J309 Allergic rhinitis, unspecified: Secondary | ICD-10-CM

## 2010-07-04 ENCOUNTER — Ambulatory Visit (INDEPENDENT_AMBULATORY_CARE_PROVIDER_SITE_OTHER): Payer: PRIVATE HEALTH INSURANCE

## 2010-07-04 DIAGNOSIS — J309 Allergic rhinitis, unspecified: Secondary | ICD-10-CM

## 2010-07-07 ENCOUNTER — Encounter: Payer: Self-pay | Admitting: Internal Medicine

## 2010-07-11 ENCOUNTER — Ambulatory Visit (INDEPENDENT_AMBULATORY_CARE_PROVIDER_SITE_OTHER): Payer: PRIVATE HEALTH INSURANCE

## 2010-07-11 DIAGNOSIS — J309 Allergic rhinitis, unspecified: Secondary | ICD-10-CM

## 2010-07-18 ENCOUNTER — Ambulatory Visit (INDEPENDENT_AMBULATORY_CARE_PROVIDER_SITE_OTHER): Payer: PRIVATE HEALTH INSURANCE

## 2010-07-18 DIAGNOSIS — J309 Allergic rhinitis, unspecified: Secondary | ICD-10-CM

## 2010-07-25 ENCOUNTER — Ambulatory Visit (INDEPENDENT_AMBULATORY_CARE_PROVIDER_SITE_OTHER): Payer: PRIVATE HEALTH INSURANCE

## 2010-07-25 DIAGNOSIS — J309 Allergic rhinitis, unspecified: Secondary | ICD-10-CM

## 2010-08-01 ENCOUNTER — Ambulatory Visit (INDEPENDENT_AMBULATORY_CARE_PROVIDER_SITE_OTHER): Payer: PRIVATE HEALTH INSURANCE

## 2010-08-01 DIAGNOSIS — J309 Allergic rhinitis, unspecified: Secondary | ICD-10-CM

## 2010-08-08 ENCOUNTER — Ambulatory Visit (INDEPENDENT_AMBULATORY_CARE_PROVIDER_SITE_OTHER): Payer: PRIVATE HEALTH INSURANCE

## 2010-08-08 DIAGNOSIS — J309 Allergic rhinitis, unspecified: Secondary | ICD-10-CM

## 2010-08-22 ENCOUNTER — Ambulatory Visit (INDEPENDENT_AMBULATORY_CARE_PROVIDER_SITE_OTHER): Payer: PRIVATE HEALTH INSURANCE

## 2010-08-22 DIAGNOSIS — J309 Allergic rhinitis, unspecified: Secondary | ICD-10-CM

## 2010-08-29 ENCOUNTER — Ambulatory Visit (INDEPENDENT_AMBULATORY_CARE_PROVIDER_SITE_OTHER): Payer: PRIVATE HEALTH INSURANCE

## 2010-08-29 DIAGNOSIS — J309 Allergic rhinitis, unspecified: Secondary | ICD-10-CM

## 2010-09-05 ENCOUNTER — Ambulatory Visit (INDEPENDENT_AMBULATORY_CARE_PROVIDER_SITE_OTHER): Payer: PRIVATE HEALTH INSURANCE

## 2010-09-05 DIAGNOSIS — J309 Allergic rhinitis, unspecified: Secondary | ICD-10-CM

## 2010-09-10 ENCOUNTER — Ambulatory Visit (INDEPENDENT_AMBULATORY_CARE_PROVIDER_SITE_OTHER): Payer: PRIVATE HEALTH INSURANCE

## 2010-09-10 DIAGNOSIS — J309 Allergic rhinitis, unspecified: Secondary | ICD-10-CM

## 2010-09-12 ENCOUNTER — Ambulatory Visit (INDEPENDENT_AMBULATORY_CARE_PROVIDER_SITE_OTHER): Payer: PRIVATE HEALTH INSURANCE

## 2010-09-12 DIAGNOSIS — J309 Allergic rhinitis, unspecified: Secondary | ICD-10-CM

## 2010-09-19 ENCOUNTER — Ambulatory Visit (INDEPENDENT_AMBULATORY_CARE_PROVIDER_SITE_OTHER): Payer: PRIVATE HEALTH INSURANCE

## 2010-09-19 DIAGNOSIS — J309 Allergic rhinitis, unspecified: Secondary | ICD-10-CM

## 2010-09-25 ENCOUNTER — Encounter: Payer: Self-pay | Admitting: Internal Medicine

## 2010-09-26 ENCOUNTER — Ambulatory Visit (INDEPENDENT_AMBULATORY_CARE_PROVIDER_SITE_OTHER): Payer: PRIVATE HEALTH INSURANCE

## 2010-09-26 DIAGNOSIS — J309 Allergic rhinitis, unspecified: Secondary | ICD-10-CM

## 2010-10-03 ENCOUNTER — Ambulatory Visit (INDEPENDENT_AMBULATORY_CARE_PROVIDER_SITE_OTHER): Payer: PRIVATE HEALTH INSURANCE

## 2010-10-03 DIAGNOSIS — J309 Allergic rhinitis, unspecified: Secondary | ICD-10-CM

## 2010-10-10 ENCOUNTER — Ambulatory Visit (INDEPENDENT_AMBULATORY_CARE_PROVIDER_SITE_OTHER): Payer: PRIVATE HEALTH INSURANCE

## 2010-10-10 DIAGNOSIS — J309 Allergic rhinitis, unspecified: Secondary | ICD-10-CM

## 2010-10-17 ENCOUNTER — Ambulatory Visit (INDEPENDENT_AMBULATORY_CARE_PROVIDER_SITE_OTHER): Payer: PRIVATE HEALTH INSURANCE

## 2010-10-17 DIAGNOSIS — J309 Allergic rhinitis, unspecified: Secondary | ICD-10-CM

## 2010-10-24 ENCOUNTER — Ambulatory Visit (INDEPENDENT_AMBULATORY_CARE_PROVIDER_SITE_OTHER): Payer: PRIVATE HEALTH INSURANCE

## 2010-10-24 DIAGNOSIS — J309 Allergic rhinitis, unspecified: Secondary | ICD-10-CM

## 2010-10-31 ENCOUNTER — Ambulatory Visit (INDEPENDENT_AMBULATORY_CARE_PROVIDER_SITE_OTHER): Payer: PRIVATE HEALTH INSURANCE

## 2010-10-31 DIAGNOSIS — J309 Allergic rhinitis, unspecified: Secondary | ICD-10-CM

## 2010-11-07 ENCOUNTER — Ambulatory Visit (INDEPENDENT_AMBULATORY_CARE_PROVIDER_SITE_OTHER): Payer: PRIVATE HEALTH INSURANCE

## 2010-11-07 DIAGNOSIS — J309 Allergic rhinitis, unspecified: Secondary | ICD-10-CM

## 2010-11-13 ENCOUNTER — Telehealth: Payer: Self-pay

## 2010-11-13 ENCOUNTER — Other Ambulatory Visit: Payer: Self-pay

## 2010-11-13 ENCOUNTER — Ambulatory Visit (INDEPENDENT_AMBULATORY_CARE_PROVIDER_SITE_OTHER): Payer: PRIVATE HEALTH INSURANCE | Admitting: Internal Medicine

## 2010-11-13 ENCOUNTER — Encounter: Payer: Self-pay | Admitting: Internal Medicine

## 2010-11-13 VITALS — BP 108/62 | HR 71 | Ht 71.0 in | Wt 191.4 lb

## 2010-11-13 DIAGNOSIS — Z1289 Encounter for screening for malignant neoplasm of other sites: Secondary | ICD-10-CM

## 2010-11-13 DIAGNOSIS — Z0000 Encounter for general adult medical examination without abnormal findings: Secondary | ICD-10-CM

## 2010-11-13 DIAGNOSIS — J069 Acute upper respiratory infection, unspecified: Secondary | ICD-10-CM

## 2010-11-13 DIAGNOSIS — J301 Allergic rhinitis due to pollen: Secondary | ICD-10-CM

## 2010-11-13 MED ORDER — LEVOTHYROXINE SODIUM 25 MCG PO TABS: 25.0000 ug | ORAL_TABLET | Freq: Every day | ORAL | Status: DC

## 2010-11-13 NOTE — Progress Notes (Signed)
11/13/10- 60 year old male former smoker followed for allergic rhinitis complicated by history of mood disorder Last here -09/13/2009 He continues allergy shots and feels they make a real difference for him and/or sufficient most of the time. He is not needing additional antihistamines. House dust exposure will still cause some nasal congestion and he expects head and chest congestion with weather changes. 3 days ago was onset of her typical episode. He used zinc tablets, vitamin B12 and Vick's VapoRub to get control. Feeling much better now. He has been exercising regularly. Will get his flu vaccine here with his allergy shots.  ROS See HPI Constitutional:   No-   weight loss, night sweats, fevers, chills, fatigue, lassitude. HEENT:   No-  headaches, difficulty swallowing, tooth/dental problems, sore throat,       No-  sneezing, itching, ear ache,+ occasional nasal congestion, post nasal drip,  CV:  No-   chest pain, orthopnea, PND, swelling in lower extremities, anasarca, dizziness, palpitations Resp: No-   shortness of breath with exertion or at rest.              No-   productive cough,  No non-productive cough,  No-  coughing up of blood.              No-   change in color of mucus.  No- wheezing.   Skin: No-   rash or lesions. GI:  No-   heartburn, indigestion, abdominal pain, nausea, vomiting, diarrhea,                 change in bowel habits, loss of appetite GU: No-   dysuria, change in color of urine, no urgency or frequency.  No- flank pain. MS:  No-   joint pain or swelling.  No- decreased range of motion.  No- back pain. Neuro- grossly normal to observation, Or:  Psych:  No- change in mood or affect. No depression or anxiety.  No memory loss.  General- Alert, Oriented, Affect-appropriate, Distress- none acute Skin- rash-none, lesions- none, excoriation- none Lymphadenopathy- none Head- atraumatic            Eyes- Gross vision intact, PERRLA, conjunctivae clear secretions     Ears- Hearing, canals-normal            Nose- mucus bridging, no-Septal dev,, polyps, erosion, perforation             Throat- Mallampati II , mucosa clear , drainage- none, tonsils- atrophic Neck- flexible , trachea midline, no stridor , thyroid nl, carotid no bruit Chest - symmetrical excursion , unlabored           Heart/CV- RRR , no murmur , no gallop  , no rub, nl s1 s2                           - JVD- none , edema- none, stasis changes- none, varices- none           Lung- clear to P&A, wheeze- none, cough- none , dullness-none, rub- none           Chest wall-  Abd- tender-no, distended-no, bowel sounds-present, HSM- no Br/ Gen/ Rectal- Not done, not indicated Extrem- cyanosis- none, clubbing, none, atrophy- none, strength- nl Neuro- grossly intact to observation

## 2010-11-13 NOTE — Patient Instructions (Signed)
Continue allergy vaccine  Flu vaccine tomorrow as planned  Please call as needed

## 2010-11-13 NOTE — Telephone Encounter (Signed)
Put order in for physical labs. 

## 2010-11-14 ENCOUNTER — Ambulatory Visit (INDEPENDENT_AMBULATORY_CARE_PROVIDER_SITE_OTHER): Payer: PRIVATE HEALTH INSURANCE

## 2010-11-14 DIAGNOSIS — J309 Allergic rhinitis, unspecified: Secondary | ICD-10-CM

## 2010-11-15 NOTE — Assessment & Plan Note (Addendum)
He continues with generally very good control and is quite satisfied. We discussed realistic goals and expectations again. We discussed decisions about duration of therapy.

## 2010-11-15 NOTE — Assessment & Plan Note (Signed)
Recent symptom exacerbation may have been a viral URI. He has managed it himself.

## 2010-11-24 ENCOUNTER — Ambulatory Visit (INDEPENDENT_AMBULATORY_CARE_PROVIDER_SITE_OTHER): Payer: PRIVATE HEALTH INSURANCE

## 2010-11-24 DIAGNOSIS — J309 Allergic rhinitis, unspecified: Secondary | ICD-10-CM

## 2010-12-03 ENCOUNTER — Ambulatory Visit (INDEPENDENT_AMBULATORY_CARE_PROVIDER_SITE_OTHER): Payer: PRIVATE HEALTH INSURANCE

## 2010-12-03 DIAGNOSIS — J309 Allergic rhinitis, unspecified: Secondary | ICD-10-CM

## 2010-12-12 ENCOUNTER — Ambulatory Visit (INDEPENDENT_AMBULATORY_CARE_PROVIDER_SITE_OTHER): Payer: PRIVATE HEALTH INSURANCE

## 2010-12-12 DIAGNOSIS — J309 Allergic rhinitis, unspecified: Secondary | ICD-10-CM

## 2010-12-19 ENCOUNTER — Ambulatory Visit (INDEPENDENT_AMBULATORY_CARE_PROVIDER_SITE_OTHER): Payer: PRIVATE HEALTH INSURANCE

## 2010-12-19 DIAGNOSIS — J309 Allergic rhinitis, unspecified: Secondary | ICD-10-CM

## 2010-12-26 ENCOUNTER — Ambulatory Visit (INDEPENDENT_AMBULATORY_CARE_PROVIDER_SITE_OTHER): Payer: PRIVATE HEALTH INSURANCE

## 2010-12-26 DIAGNOSIS — J309 Allergic rhinitis, unspecified: Secondary | ICD-10-CM

## 2010-12-27 ENCOUNTER — Encounter: Payer: Self-pay | Admitting: Internal Medicine

## 2010-12-27 DIAGNOSIS — Z0000 Encounter for general adult medical examination without abnormal findings: Secondary | ICD-10-CM | POA: Insufficient documentation

## 2010-12-29 ENCOUNTER — Other Ambulatory Visit (INDEPENDENT_AMBULATORY_CARE_PROVIDER_SITE_OTHER): Payer: PRIVATE HEALTH INSURANCE

## 2010-12-29 DIAGNOSIS — Z1289 Encounter for screening for malignant neoplasm of other sites: Secondary | ICD-10-CM

## 2010-12-29 DIAGNOSIS — Z0000 Encounter for general adult medical examination without abnormal findings: Secondary | ICD-10-CM

## 2010-12-29 LAB — HEPATIC FUNCTION PANEL
AST: 24 U/L (ref 0–37)
Albumin: 4.3 g/dL (ref 3.5–5.2)
Total Protein: 6.8 g/dL (ref 6.0–8.3)

## 2010-12-29 LAB — CBC WITH DIFFERENTIAL/PLATELET
Basophils Relative: 0.3 % (ref 0.0–3.0)
Eosinophils Relative: 4 % (ref 0.0–5.0)
HCT: 45.2 % (ref 39.0–52.0)
Hemoglobin: 15.5 g/dL (ref 13.0–17.0)
Lymphs Abs: 1.7 10*3/uL (ref 0.7–4.0)
MCV: 95.5 fl (ref 78.0–100.0)
Monocytes Absolute: 0.7 10*3/uL (ref 0.1–1.0)
Monocytes Relative: 12.9 % — ABNORMAL HIGH (ref 3.0–12.0)
RBC: 4.73 Mil/uL (ref 4.22–5.81)
WBC: 5.7 10*3/uL (ref 4.5–10.5)

## 2010-12-29 LAB — TSH: TSH: 3.49 u[IU]/mL (ref 0.35–5.50)

## 2010-12-29 LAB — PSA: PSA: 0 ng/mL — ABNORMAL LOW (ref 0.10–4.00)

## 2010-12-29 LAB — BASIC METABOLIC PANEL
BUN: 24 mg/dL — ABNORMAL HIGH (ref 6–23)
CO2: 27 mEq/L (ref 19–32)
Calcium: 9 mg/dL (ref 8.4–10.5)
GFR: 94.73 mL/min (ref >60.00)
Glucose, Bld: 103 mg/dL — ABNORMAL HIGH (ref 70–99)

## 2010-12-29 LAB — LIPID PANEL
HDL: 54.9 mg/dL (ref >39.00)
Total CHOL/HDL Ratio: 4
VLDL: 11.2 mg/dL (ref 0.0–40.0)

## 2010-12-29 LAB — URINALYSIS, ROUTINE W REFLEX MICROSCOPIC
Hgb urine dipstick: NEGATIVE
Ketones, ur: NEGATIVE
Leukocytes, UA: NEGATIVE
Specific Gravity, Urine: 1.025 (ref 1.000–1.030)
Urine Glucose: NEGATIVE
Urobilinogen, UA: 0.2 (ref 0.0–1.0)

## 2011-01-01 ENCOUNTER — Encounter: Payer: Self-pay | Admitting: Internal Medicine

## 2011-01-01 ENCOUNTER — Ambulatory Visit (INDEPENDENT_AMBULATORY_CARE_PROVIDER_SITE_OTHER): Payer: PRIVATE HEALTH INSURANCE | Admitting: Internal Medicine

## 2011-01-01 VITALS — BP 120/68 | HR 81 | Temp 98.3°F | Ht 70.0 in | Wt 191.0 lb

## 2011-01-01 DIAGNOSIS — E785 Hyperlipidemia, unspecified: Secondary | ICD-10-CM | POA: Insufficient documentation

## 2011-01-01 DIAGNOSIS — Z0000 Encounter for general adult medical examination without abnormal findings: Secondary | ICD-10-CM

## 2011-01-01 MED ORDER — LEVOTHYROXINE SODIUM 25 MCG PO TABS: 25.0000 ug | ORAL_TABLET | Freq: Every day | ORAL | Status: AC

## 2011-01-01 NOTE — Assessment & Plan Note (Signed)

## 2011-01-01 NOTE — Patient Instructions (Addendum)
Continue all other medications as before Your refill was sent today You are otherwise up to date with prevention today Please follow lower cholesterol diet Please return in 1 year for your yearly visit, or sooner if needed, with Lab testing done 3-5 days before

## 2011-01-01 NOTE — Assessment & Plan Note (Signed)
Mild, new onset with more liberal diet, for improved diet, f/u next visit, goal ldl < 100

## 2011-01-02 ENCOUNTER — Ambulatory Visit (INDEPENDENT_AMBULATORY_CARE_PROVIDER_SITE_OTHER): Payer: PRIVATE HEALTH INSURANCE

## 2011-01-02 DIAGNOSIS — J309 Allergic rhinitis, unspecified: Secondary | ICD-10-CM

## 2011-01-03 ENCOUNTER — Encounter: Payer: Self-pay | Admitting: Internal Medicine

## 2011-01-03 NOTE — Progress Notes (Signed)
Subjective:    Patient ID: Thomas Carroll, male    DOB: Jan 27, 1951, 60 y.o.   MRN: [id number]  HPI  Here for wellness and f/u;  Overall doing ok;  Pt denies CP, worsening SOB, DOE, wheezing, orthopnea, PND, worsening LE edema, palpitations, dizziness or syncope.  Pt denies neurological change such as new Headache, facial or extremity weakness.  Pt denies polydipsia, polyuria, or low sugar symptoms. Pt states overall good compliance with treatment and medications, good tolerability, and trying to follow lower cholesterol diet.  Pt denies worsening depressive symptoms, suicidal ideation or panic. No fever, wt loss, night sweats, loss of appetite, or other constitutional symptoms.  Pt states good ability with ADL's, low fall risk, home safety reviewed and adequate, no significant changes in hearing or vision, and occasionally active with exercise. No other new complaints Past Medical History  Diagnosis Date  . Prostate cancer 2007  . Depression   . Allergic rhinitis     SKIN TEST POS  06/06/2008  . Bipolar 1 disorder   . Hypothyroidism    Past Surgical History  Procedure Date  . Prostatectomy 2007  . Tonsillectomy   . Appendectomy   . Penile prosthesis implant 07/2007    [facility]    reports that he has quit smoking. He does not have any smokeless tobacco history on file. He reports that he drinks alcohol. His drug history not on file. family history is not on file. No Known Allergies Current Outpatient Prescriptions on File Prior to Visit  Medication Sig Dispense Refill  . aspirin 81 MG tablet Take 81 mg by mouth daily.        Marland Kitchen lamoTRIgine (LAMICTAL) 100 MG tablet TAKE 1 1/2 TAB EVERY MORNING AND 2 TABLETS BY MOUTH AT BEDTIME       . traZODone (DESYREL) 100 MG tablet Take 200 mg by mouth at bedtime.         Review of Systems Review of Systems  Constitutional: Negative for diaphoresis, activity change, appetite change and unexpected weight change.  HENT: Negative for hearing  loss, ear pain, facial swelling, mouth sores and neck stiffness.   Eyes: Negative for pain, redness and visual disturbance.  Respiratory: Negative for shortness of breath and wheezing.   Cardiovascular: Negative for chest pain and palpitations.  Gastrointestinal: Negative for diarrhea, blood in stool, abdominal distention and rectal pain.  Genitourinary: Negative for hematuria, flank pain and decreased urine volume.  Musculoskeletal: Negative for myalgias and joint swelling.  Skin: Negative for color change and wound.  Neurological: Negative for syncope and numbness.  Hematological: Negative for adenopathy.  Psychiatric/Behavioral: Negative for hallucinations, self-injury, decreased concentration and agitation.      Objective:   Physical Exam BP 120/68  Pulse 81  Temp(Src) 98.3 F (36.8 C) (Oral)  Ht 5\' 10"  (1.778 m)  Wt 191 lb (86.637 kg)  BMI 27.41 kg/m2  SpO2 94% Physical Exam  VS noted, not ill appearing Constitutional: Pt is oriented to person, place, and time. Appears well-developed and well-nourished.  HENT:  Head: Normocephalic and atraumatic.  Right Ear: External ear normal.  Left Ear: External ear normal.  Nose: Nose normal.  Mouth/Throat: Oropharynx is clear and moist.  Eyes: Conjunctivae and EOM are normal. Pupils are equal, round, and reactive to light.  Neck: Normal range of motion. Neck supple. No JVD present. No tracheal deviation present.  Cardiovascular: Normal rate, regular rhythm, normal heart sounds and intact distal pulses.   Pulmonary/Chest: Effort normal and breath sounds normal.  Abdominal: Soft. Bowel sounds are normal. There is no tenderness.  Musculoskeletal: Normal range of motion. Exhibits no edema.  Lymphadenopathy:  Has no cervical adenopathy.  Neurological: Pt is alert and oriented to person, place, and time. Pt has normal reflexes. No cranial nerve deficit.  Skin: Skin is warm and dry. No rash noted.  Psychiatric:  Somewhat nervous. Behavior  is normal.     Assessment & Plan:

## 2011-01-05 ENCOUNTER — Ambulatory Visit (INDEPENDENT_AMBULATORY_CARE_PROVIDER_SITE_OTHER): Payer: PRIVATE HEALTH INSURANCE

## 2011-01-05 DIAGNOSIS — J309 Allergic rhinitis, unspecified: Secondary | ICD-10-CM

## 2011-01-09 ENCOUNTER — Ambulatory Visit (INDEPENDENT_AMBULATORY_CARE_PROVIDER_SITE_OTHER): Payer: PRIVATE HEALTH INSURANCE

## 2011-01-09 DIAGNOSIS — J309 Allergic rhinitis, unspecified: Secondary | ICD-10-CM

## 2011-01-16 ENCOUNTER — Ambulatory Visit (INDEPENDENT_AMBULATORY_CARE_PROVIDER_SITE_OTHER): Payer: PRIVATE HEALTH INSURANCE

## 2011-01-16 DIAGNOSIS — J309 Allergic rhinitis, unspecified: Secondary | ICD-10-CM

## 2011-01-19 ENCOUNTER — Encounter: Payer: Self-pay | Admitting: Internal Medicine

## 2011-01-23 ENCOUNTER — Ambulatory Visit (INDEPENDENT_AMBULATORY_CARE_PROVIDER_SITE_OTHER): Payer: PRIVATE HEALTH INSURANCE

## 2011-01-23 DIAGNOSIS — J309 Allergic rhinitis, unspecified: Secondary | ICD-10-CM

## 2011-01-30 ENCOUNTER — Ambulatory Visit (INDEPENDENT_AMBULATORY_CARE_PROVIDER_SITE_OTHER): Payer: PRIVATE HEALTH INSURANCE

## 2011-01-30 DIAGNOSIS — J309 Allergic rhinitis, unspecified: Secondary | ICD-10-CM

## 2011-02-06 ENCOUNTER — Ambulatory Visit (INDEPENDENT_AMBULATORY_CARE_PROVIDER_SITE_OTHER): Payer: PRIVATE HEALTH INSURANCE

## 2011-02-06 DIAGNOSIS — J309 Allergic rhinitis, unspecified: Secondary | ICD-10-CM

## 2011-02-13 ENCOUNTER — Ambulatory Visit (INDEPENDENT_AMBULATORY_CARE_PROVIDER_SITE_OTHER): Payer: PRIVATE HEALTH INSURANCE

## 2011-02-13 DIAGNOSIS — J309 Allergic rhinitis, unspecified: Secondary | ICD-10-CM

## 2011-02-20 ENCOUNTER — Ambulatory Visit (INDEPENDENT_AMBULATORY_CARE_PROVIDER_SITE_OTHER): Payer: PRIVATE HEALTH INSURANCE

## 2011-02-20 DIAGNOSIS — J309 Allergic rhinitis, unspecified: Secondary | ICD-10-CM

## 2011-02-27 ENCOUNTER — Ambulatory Visit (INDEPENDENT_AMBULATORY_CARE_PROVIDER_SITE_OTHER): Payer: PRIVATE HEALTH INSURANCE

## 2011-02-27 DIAGNOSIS — J309 Allergic rhinitis, unspecified: Secondary | ICD-10-CM

## 2011-03-06 ENCOUNTER — Ambulatory Visit (INDEPENDENT_AMBULATORY_CARE_PROVIDER_SITE_OTHER): Payer: PRIVATE HEALTH INSURANCE

## 2011-03-06 DIAGNOSIS — J309 Allergic rhinitis, unspecified: Secondary | ICD-10-CM

## 2011-03-13 ENCOUNTER — Ambulatory Visit (INDEPENDENT_AMBULATORY_CARE_PROVIDER_SITE_OTHER): Payer: PRIVATE HEALTH INSURANCE

## 2011-03-13 DIAGNOSIS — J309 Allergic rhinitis, unspecified: Secondary | ICD-10-CM

## 2011-03-20 ENCOUNTER — Ambulatory Visit (INDEPENDENT_AMBULATORY_CARE_PROVIDER_SITE_OTHER): Payer: PRIVATE HEALTH INSURANCE

## 2011-03-20 DIAGNOSIS — J309 Allergic rhinitis, unspecified: Secondary | ICD-10-CM

## 2011-03-27 ENCOUNTER — Ambulatory Visit (INDEPENDENT_AMBULATORY_CARE_PROVIDER_SITE_OTHER): Payer: PRIVATE HEALTH INSURANCE

## 2011-03-27 DIAGNOSIS — J309 Allergic rhinitis, unspecified: Secondary | ICD-10-CM

## 2011-04-03 ENCOUNTER — Ambulatory Visit (INDEPENDENT_AMBULATORY_CARE_PROVIDER_SITE_OTHER): Payer: PRIVATE HEALTH INSURANCE

## 2011-04-03 DIAGNOSIS — J309 Allergic rhinitis, unspecified: Secondary | ICD-10-CM

## 2011-04-10 ENCOUNTER — Ambulatory Visit (INDEPENDENT_AMBULATORY_CARE_PROVIDER_SITE_OTHER): Payer: PRIVATE HEALTH INSURANCE

## 2011-04-10 DIAGNOSIS — J309 Allergic rhinitis, unspecified: Secondary | ICD-10-CM

## 2011-04-17 ENCOUNTER — Ambulatory Visit (INDEPENDENT_AMBULATORY_CARE_PROVIDER_SITE_OTHER): Payer: PRIVATE HEALTH INSURANCE

## 2011-04-17 DIAGNOSIS — J309 Allergic rhinitis, unspecified: Secondary | ICD-10-CM

## 2011-04-21 ENCOUNTER — Encounter: Payer: Self-pay | Admitting: Internal Medicine

## 2011-04-24 ENCOUNTER — Ambulatory Visit (INDEPENDENT_AMBULATORY_CARE_PROVIDER_SITE_OTHER): Payer: PRIVATE HEALTH INSURANCE

## 2011-04-24 DIAGNOSIS — J309 Allergic rhinitis, unspecified: Secondary | ICD-10-CM

## 2011-05-06 ENCOUNTER — Ambulatory Visit (INDEPENDENT_AMBULATORY_CARE_PROVIDER_SITE_OTHER): Payer: PRIVATE HEALTH INSURANCE

## 2011-05-06 DIAGNOSIS — J309 Allergic rhinitis, unspecified: Secondary | ICD-10-CM

## 2011-05-11 ENCOUNTER — Ambulatory Visit (INDEPENDENT_AMBULATORY_CARE_PROVIDER_SITE_OTHER): Payer: PRIVATE HEALTH INSURANCE

## 2011-05-11 DIAGNOSIS — J309 Allergic rhinitis, unspecified: Secondary | ICD-10-CM

## 2011-05-19 ENCOUNTER — Ambulatory Visit (INDEPENDENT_AMBULATORY_CARE_PROVIDER_SITE_OTHER): Payer: PRIVATE HEALTH INSURANCE

## 2011-05-19 DIAGNOSIS — J309 Allergic rhinitis, unspecified: Secondary | ICD-10-CM

## 2011-05-28 ENCOUNTER — Ambulatory Visit (INDEPENDENT_AMBULATORY_CARE_PROVIDER_SITE_OTHER): Payer: PRIVATE HEALTH INSURANCE

## 2011-05-28 DIAGNOSIS — J309 Allergic rhinitis, unspecified: Secondary | ICD-10-CM

## 2011-06-05 ENCOUNTER — Ambulatory Visit (INDEPENDENT_AMBULATORY_CARE_PROVIDER_SITE_OTHER): Payer: PRIVATE HEALTH INSURANCE

## 2011-06-05 DIAGNOSIS — J309 Allergic rhinitis, unspecified: Secondary | ICD-10-CM

## 2011-06-12 ENCOUNTER — Ambulatory Visit (INDEPENDENT_AMBULATORY_CARE_PROVIDER_SITE_OTHER): Payer: PRIVATE HEALTH INSURANCE

## 2011-06-12 DIAGNOSIS — J309 Allergic rhinitis, unspecified: Secondary | ICD-10-CM

## 2011-06-19 ENCOUNTER — Ambulatory Visit (INDEPENDENT_AMBULATORY_CARE_PROVIDER_SITE_OTHER): Payer: PRIVATE HEALTH INSURANCE

## 2011-06-19 DIAGNOSIS — J309 Allergic rhinitis, unspecified: Secondary | ICD-10-CM

## 2011-06-26 ENCOUNTER — Ambulatory Visit (INDEPENDENT_AMBULATORY_CARE_PROVIDER_SITE_OTHER): Payer: PRIVATE HEALTH INSURANCE

## 2011-06-26 DIAGNOSIS — J309 Allergic rhinitis, unspecified: Secondary | ICD-10-CM

## 2011-07-03 ENCOUNTER — Ambulatory Visit (INDEPENDENT_AMBULATORY_CARE_PROVIDER_SITE_OTHER): Payer: PRIVATE HEALTH INSURANCE

## 2011-07-03 DIAGNOSIS — J309 Allergic rhinitis, unspecified: Secondary | ICD-10-CM

## 2011-07-10 ENCOUNTER — Ambulatory Visit (INDEPENDENT_AMBULATORY_CARE_PROVIDER_SITE_OTHER): Payer: PRIVATE HEALTH INSURANCE

## 2011-07-10 DIAGNOSIS — J309 Allergic rhinitis, unspecified: Secondary | ICD-10-CM

## 2011-07-17 ENCOUNTER — Ambulatory Visit (INDEPENDENT_AMBULATORY_CARE_PROVIDER_SITE_OTHER): Payer: PRIVATE HEALTH INSURANCE

## 2011-07-17 DIAGNOSIS — J309 Allergic rhinitis, unspecified: Secondary | ICD-10-CM

## 2011-07-24 ENCOUNTER — Ambulatory Visit (INDEPENDENT_AMBULATORY_CARE_PROVIDER_SITE_OTHER): Payer: PRIVATE HEALTH INSURANCE

## 2011-07-24 DIAGNOSIS — J309 Allergic rhinitis, unspecified: Secondary | ICD-10-CM

## 2011-07-31 ENCOUNTER — Ambulatory Visit (INDEPENDENT_AMBULATORY_CARE_PROVIDER_SITE_OTHER): Payer: PRIVATE HEALTH INSURANCE

## 2011-07-31 DIAGNOSIS — J309 Allergic rhinitis, unspecified: Secondary | ICD-10-CM

## 2011-08-07 ENCOUNTER — Ambulatory Visit (INDEPENDENT_AMBULATORY_CARE_PROVIDER_SITE_OTHER): Payer: PRIVATE HEALTH INSURANCE

## 2011-08-07 DIAGNOSIS — J309 Allergic rhinitis, unspecified: Secondary | ICD-10-CM

## 2011-08-14 ENCOUNTER — Ambulatory Visit (INDEPENDENT_AMBULATORY_CARE_PROVIDER_SITE_OTHER): Payer: PRIVATE HEALTH INSURANCE

## 2011-08-14 DIAGNOSIS — J309 Allergic rhinitis, unspecified: Secondary | ICD-10-CM

## 2011-08-18 ENCOUNTER — Encounter: Payer: Self-pay | Admitting: Internal Medicine

## 2011-08-21 ENCOUNTER — Ambulatory Visit (INDEPENDENT_AMBULATORY_CARE_PROVIDER_SITE_OTHER): Payer: PRIVATE HEALTH INSURANCE

## 2011-08-21 DIAGNOSIS — J309 Allergic rhinitis, unspecified: Secondary | ICD-10-CM

## 2011-08-28 ENCOUNTER — Ambulatory Visit (INDEPENDENT_AMBULATORY_CARE_PROVIDER_SITE_OTHER): Payer: Self-pay

## 2011-08-28 DIAGNOSIS — J309 Allergic rhinitis, unspecified: Secondary | ICD-10-CM

## 2011-09-04 ENCOUNTER — Ambulatory Visit (INDEPENDENT_AMBULATORY_CARE_PROVIDER_SITE_OTHER): Payer: Self-pay

## 2011-09-04 DIAGNOSIS — J309 Allergic rhinitis, unspecified: Secondary | ICD-10-CM

## 2011-09-14 ENCOUNTER — Ambulatory Visit (INDEPENDENT_AMBULATORY_CARE_PROVIDER_SITE_OTHER): Payer: Self-pay

## 2011-09-14 DIAGNOSIS — J309 Allergic rhinitis, unspecified: Secondary | ICD-10-CM

## 2011-09-16 ENCOUNTER — Ambulatory Visit (INDEPENDENT_AMBULATORY_CARE_PROVIDER_SITE_OTHER): Payer: Self-pay

## 2011-09-16 DIAGNOSIS — J309 Allergic rhinitis, unspecified: Secondary | ICD-10-CM

## 2011-09-23 ENCOUNTER — Ambulatory Visit (INDEPENDENT_AMBULATORY_CARE_PROVIDER_SITE_OTHER): Payer: Self-pay

## 2011-09-23 DIAGNOSIS — J309 Allergic rhinitis, unspecified: Secondary | ICD-10-CM

## 2011-10-02 ENCOUNTER — Ambulatory Visit (INDEPENDENT_AMBULATORY_CARE_PROVIDER_SITE_OTHER): Payer: Self-pay

## 2011-10-02 DIAGNOSIS — J309 Allergic rhinitis, unspecified: Secondary | ICD-10-CM

## 2011-10-09 ENCOUNTER — Ambulatory Visit (INDEPENDENT_AMBULATORY_CARE_PROVIDER_SITE_OTHER): Payer: Self-pay

## 2011-10-09 DIAGNOSIS — J309 Allergic rhinitis, unspecified: Secondary | ICD-10-CM

## 2011-10-16 ENCOUNTER — Ambulatory Visit (INDEPENDENT_AMBULATORY_CARE_PROVIDER_SITE_OTHER): Payer: Self-pay

## 2011-10-16 DIAGNOSIS — J309 Allergic rhinitis, unspecified: Secondary | ICD-10-CM

## 2011-10-23 ENCOUNTER — Ambulatory Visit (INDEPENDENT_AMBULATORY_CARE_PROVIDER_SITE_OTHER): Payer: Self-pay

## 2011-10-23 DIAGNOSIS — J309 Allergic rhinitis, unspecified: Secondary | ICD-10-CM

## 2011-10-30 ENCOUNTER — Ambulatory Visit (INDEPENDENT_AMBULATORY_CARE_PROVIDER_SITE_OTHER): Payer: Self-pay

## 2011-10-30 DIAGNOSIS — J309 Allergic rhinitis, unspecified: Secondary | ICD-10-CM

## 2011-11-06 ENCOUNTER — Ambulatory Visit (INDEPENDENT_AMBULATORY_CARE_PROVIDER_SITE_OTHER): Payer: Self-pay

## 2011-11-06 DIAGNOSIS — J309 Allergic rhinitis, unspecified: Secondary | ICD-10-CM

## 2011-11-13 ENCOUNTER — Ambulatory Visit (INDEPENDENT_AMBULATORY_CARE_PROVIDER_SITE_OTHER): Payer: Self-pay

## 2011-11-13 DIAGNOSIS — J309 Allergic rhinitis, unspecified: Secondary | ICD-10-CM

## 2011-11-13 DIAGNOSIS — Z23 Encounter for immunization: Secondary | ICD-10-CM

## 2011-11-16 DIAGNOSIS — Z23 Encounter for immunization: Secondary | ICD-10-CM

## 2011-11-20 ENCOUNTER — Ambulatory Visit (INDEPENDENT_AMBULATORY_CARE_PROVIDER_SITE_OTHER): Payer: Self-pay

## 2011-11-20 DIAGNOSIS — J309 Allergic rhinitis, unspecified: Secondary | ICD-10-CM

## 2011-11-27 ENCOUNTER — Ambulatory Visit (INDEPENDENT_AMBULATORY_CARE_PROVIDER_SITE_OTHER): Payer: Self-pay

## 2011-11-27 DIAGNOSIS — J309 Allergic rhinitis, unspecified: Secondary | ICD-10-CM

## 2011-12-04 ENCOUNTER — Ambulatory Visit (INDEPENDENT_AMBULATORY_CARE_PROVIDER_SITE_OTHER): Payer: Self-pay

## 2011-12-04 DIAGNOSIS — J309 Allergic rhinitis, unspecified: Secondary | ICD-10-CM

## 2011-12-11 ENCOUNTER — Ambulatory Visit (INDEPENDENT_AMBULATORY_CARE_PROVIDER_SITE_OTHER): Payer: Self-pay

## 2011-12-11 DIAGNOSIS — J309 Allergic rhinitis, unspecified: Secondary | ICD-10-CM

## 2011-12-18 ENCOUNTER — Ambulatory Visit (INDEPENDENT_AMBULATORY_CARE_PROVIDER_SITE_OTHER): Payer: Self-pay

## 2011-12-18 DIAGNOSIS — J309 Allergic rhinitis, unspecified: Secondary | ICD-10-CM

## 2011-12-25 ENCOUNTER — Ambulatory Visit (INDEPENDENT_AMBULATORY_CARE_PROVIDER_SITE_OTHER): Payer: Self-pay

## 2011-12-25 DIAGNOSIS — J309 Allergic rhinitis, unspecified: Secondary | ICD-10-CM

## 2012-01-01 ENCOUNTER — Ambulatory Visit (INDEPENDENT_AMBULATORY_CARE_PROVIDER_SITE_OTHER): Payer: Self-pay

## 2012-01-01 DIAGNOSIS — J309 Allergic rhinitis, unspecified: Secondary | ICD-10-CM

## 2012-01-20 ENCOUNTER — Ambulatory Visit (INDEPENDENT_AMBULATORY_CARE_PROVIDER_SITE_OTHER): Payer: Self-pay

## 2012-01-20 DIAGNOSIS — J309 Allergic rhinitis, unspecified: Secondary | ICD-10-CM

## 2012-01-29 ENCOUNTER — Ambulatory Visit (INDEPENDENT_AMBULATORY_CARE_PROVIDER_SITE_OTHER): Payer: Self-pay

## 2012-01-29 DIAGNOSIS — J309 Allergic rhinitis, unspecified: Secondary | ICD-10-CM

## 2012-02-09 ENCOUNTER — Ambulatory Visit (INDEPENDENT_AMBULATORY_CARE_PROVIDER_SITE_OTHER): Payer: BC Managed Care – PPO

## 2012-02-09 DIAGNOSIS — J309 Allergic rhinitis, unspecified: Secondary | ICD-10-CM

## 2012-02-10 ENCOUNTER — Ambulatory Visit (INDEPENDENT_AMBULATORY_CARE_PROVIDER_SITE_OTHER): Payer: BC Managed Care – PPO

## 2012-02-10 DIAGNOSIS — J309 Allergic rhinitis, unspecified: Secondary | ICD-10-CM

## 2012-02-17 ENCOUNTER — Ambulatory Visit (INDEPENDENT_AMBULATORY_CARE_PROVIDER_SITE_OTHER): Payer: BC Managed Care – PPO

## 2012-02-17 DIAGNOSIS — J309 Allergic rhinitis, unspecified: Secondary | ICD-10-CM

## 2012-02-24 ENCOUNTER — Ambulatory Visit: Payer: Self-pay

## 2012-02-26 ENCOUNTER — Ambulatory Visit (INDEPENDENT_AMBULATORY_CARE_PROVIDER_SITE_OTHER): Payer: BC Managed Care – PPO

## 2012-02-26 DIAGNOSIS — J309 Allergic rhinitis, unspecified: Secondary | ICD-10-CM

## 2012-03-04 ENCOUNTER — Ambulatory Visit: Payer: Self-pay

## 2012-03-07 ENCOUNTER — Ambulatory Visit (INDEPENDENT_AMBULATORY_CARE_PROVIDER_SITE_OTHER): Payer: BC Managed Care – PPO

## 2012-03-07 DIAGNOSIS — J309 Allergic rhinitis, unspecified: Secondary | ICD-10-CM

## 2012-03-14 ENCOUNTER — Ambulatory Visit (INDEPENDENT_AMBULATORY_CARE_PROVIDER_SITE_OTHER): Payer: BC Managed Care – PPO

## 2012-03-14 DIAGNOSIS — J309 Allergic rhinitis, unspecified: Secondary | ICD-10-CM

## 2012-03-15 ENCOUNTER — Ambulatory Visit: Payer: Self-pay | Admitting: Internal Medicine

## 2012-03-16 ENCOUNTER — Encounter: Payer: Self-pay | Admitting: Internal Medicine

## 2012-03-21 ENCOUNTER — Ambulatory Visit: Payer: BC Managed Care – PPO

## 2012-04-01 ENCOUNTER — Ambulatory Visit (INDEPENDENT_AMBULATORY_CARE_PROVIDER_SITE_OTHER): Payer: BC Managed Care – PPO

## 2012-04-01 DIAGNOSIS — J309 Allergic rhinitis, unspecified: Secondary | ICD-10-CM

## 2012-04-15 ENCOUNTER — Ambulatory Visit: Payer: Self-pay | Admitting: Internal Medicine

## 2012-04-21 ENCOUNTER — Telehealth: Payer: Self-pay | Admitting: Internal Medicine

## 2012-04-21 NOTE — Telephone Encounter (Signed)
[place]oke with pt,,,,,, [place] injections [place] there at a dr's office. Pt will let us know Which one he choose's > still wants to come up here and see you once a year for his allergies.  Dr Maple Hudson is this ok:  Thank you

## 2012-04-21 NOTE — Telephone Encounter (Signed)
[name] found a Dr.'s office that is familiar with allergy shots and is willing to give them. The practice is: [facility],contact person: Melchor Amour ph# [phone]. I have pt.'s new address in case you decide to do this. I will call and discuss protocol with [name] and get their fax # and send the necessary paperwork. Just let me know what you decide.

## 2012-04-22 NOTE — Telephone Encounter (Signed)
Discussed with [place] Abts. Ok to work with patient's chosen medical office with understanding he gets vaccine injections there by our protocol; they don't have him give his own vaccine, and they have good communication with our allergy lab about any questions or concerns.

## 2012-04-25 NOTE — Telephone Encounter (Signed)
Will follow protocol and give [name] his vaccine when he comes in this Fri.(04/29/12)and make sure that the dr.'s office will be giving the shots not the pt.Thomas Carroll

## 2012-05-10 ENCOUNTER — Ambulatory Visit (INDEPENDENT_AMBULATORY_CARE_PROVIDER_SITE_OTHER): Payer: BC Managed Care – PPO

## 2012-05-10 ENCOUNTER — Encounter: Payer: Self-pay | Admitting: Internal Medicine

## 2012-05-10 ENCOUNTER — Ambulatory Visit (INDEPENDENT_AMBULATORY_CARE_PROVIDER_SITE_OTHER)
Admission: RE | Admit: 2012-05-10 | Discharge: 2012-05-10 | Disposition: A | Discharge status: Home / Self Care | Payer: BC Managed Care – PPO | Source: Ambulatory Visit | Attending: Internal Medicine | Admitting: Internal Medicine

## 2012-05-10 ENCOUNTER — Ambulatory Visit (INDEPENDENT_AMBULATORY_CARE_PROVIDER_SITE_OTHER): Payer: BC Managed Care – PPO | Admitting: Internal Medicine

## 2012-05-10 VITALS — BP 118/60 | HR 72 | Ht 69.5 in | Wt 184.2 lb

## 2012-05-10 DIAGNOSIS — Z87891 Personal history of nicotine dependence: Secondary | ICD-10-CM

## 2012-05-10 DIAGNOSIS — J301 Allergic rhinitis due to pollen: Secondary | ICD-10-CM

## 2012-05-10 DIAGNOSIS — J309 Allergic rhinitis, unspecified: Secondary | ICD-10-CM

## 2012-05-10 MED ORDER — EPINEPHRINE 0.3 MG/0.3ML IJ DEVI: 0.3000 mg | Freq: Once | INTRAMUSCULAR | Status: AC

## 2012-05-10 NOTE — Progress Notes (Signed)
11/13/10- 62 year old male former smoker followed for allergic rhinitis complicated by history of mood disorder Last here -09/13/2009 He continues allergy shots and feels they make a real difference for him and/or sufficient most of the time. He is not needing additional antihistamines. House dust exposure will still cause some nasal congestion and he expects head and chest congestion with weather changes. 3 days ago was onset of her typical episode. He used zinc tablets, vitamin B12 and Vick's VapoRub to get control. Feeling much better now. He has been exercising regularly. Will get his flu vaccine here with his allergy shots.  05/10/12-  62 year old male former smoker followed for allergic rhinitis complicated by history of mood disorder FOLLOWS FOR: still on vaccine,  pt, reports that he feels better, no chest/nasal congestion.  pt. needs rx for epipen He reports that allergy vaccine 1:10 GH definitely helps and is worth continuing. Denies any cough, wheeze, chest pain. Exercises everyday  ROS-See HPI Constitutional:   No-   weight loss, night sweats, fevers, chills, fatigue, lassitude. HEENT:   No-  headaches, difficulty swallowing, tooth/dental problems, sore throat,       No-  sneezing, itching, ear ache,+ occasional nasal congestion, post nasal drip,  CV:  No-   chest pain, orthopnea, PND, swelling in lower extremities, anasarca, dizziness, palpitations Resp: No-   shortness of breath with exertion or at rest.              No-   productive cough,  No non-productive cough,  No-  coughing up of blood.              No-   change in color of mucus.  No- wheezing.   Skin: No-   rash or lesions. GI:  No-   heartburn, indigestion, abdominal pain, nausea, vomiting,  GU:  MS:  No-   joint pain or swelling.   Neuro- nothing unusual  Psych:  No- change in mood or affect. No depression or anxiety.  No memory loss.  General- Alert, Oriented, Affect-appropriate, Distress- none acute. Looks trim and  and fit. Skin- rash-none, lesions- none, excoriation- none Lymphadenopathy- none Head- atraumatic            Eyes- Gross vision intact, PERRLA, conjunctivae clear secretions            Ears- Hearing, canals-normal            Nose- mucus bridging, no-Septal dev,, polyps, erosion, perforation             Throat- Mallampati II , mucosa clear , drainage- none, tonsils- atrophic Neck- flexible , trachea midline, no stridor , thyroid nl, carotid no bruit Chest - symmetrical excursion , unlabored           Heart/CV- RRR , no murmur , no gallop  , no rub, nl s1 s2                           - JVD- none , edema- none, stasis changes- none, varices- none           Lung- clear to P&A, wheeze- none, cough- none , dullness-none, rub- none           Chest wall-  Abd- Br/ Gen/ Rectal- Not done, not indicated Extrem- cyanosis- none, clubbing, none, atrophy- none, strength- nl Neuro- grossly intact to observation

## 2012-05-10 NOTE — Patient Instructions (Addendum)
Order- CXR   Hx tobacco use   Script sent for Epipen refill  We can continue allergy vaccine here 1:10  Please call as needed

## 2012-05-17 ENCOUNTER — Encounter: Payer: Self-pay | Admitting: Internal Medicine

## 2012-05-17 NOTE — Assessment & Plan Note (Signed)
Appropriate to continue allergy vaccine. No recent chest symptoms , but is a former smoker, will get chest x-ray for baseline. Plan-refill EpiPen, CXR

## 2012-06-20 ENCOUNTER — Encounter: Payer: Self-pay | Admitting: Internal Medicine

## 2012-08-03 ENCOUNTER — Ambulatory Visit (INDEPENDENT_AMBULATORY_CARE_PROVIDER_SITE_OTHER): Payer: BC Managed Care – PPO

## 2012-08-03 DIAGNOSIS — J309 Allergic rhinitis, unspecified: Secondary | ICD-10-CM

## 2013-05-11 ENCOUNTER — Ambulatory Visit: Payer: BC Managed Care – PPO | Admitting: Internal Medicine

## 2013-08-29 IMAGING — CR DG CHEST 2V
2 series · 2 of 2 positions shown · non-contrast
Comparison: None.

CLINICAL DATA: Tobacco use

CHEST - 2 VIEW

[view not recorded (1 of 2)]
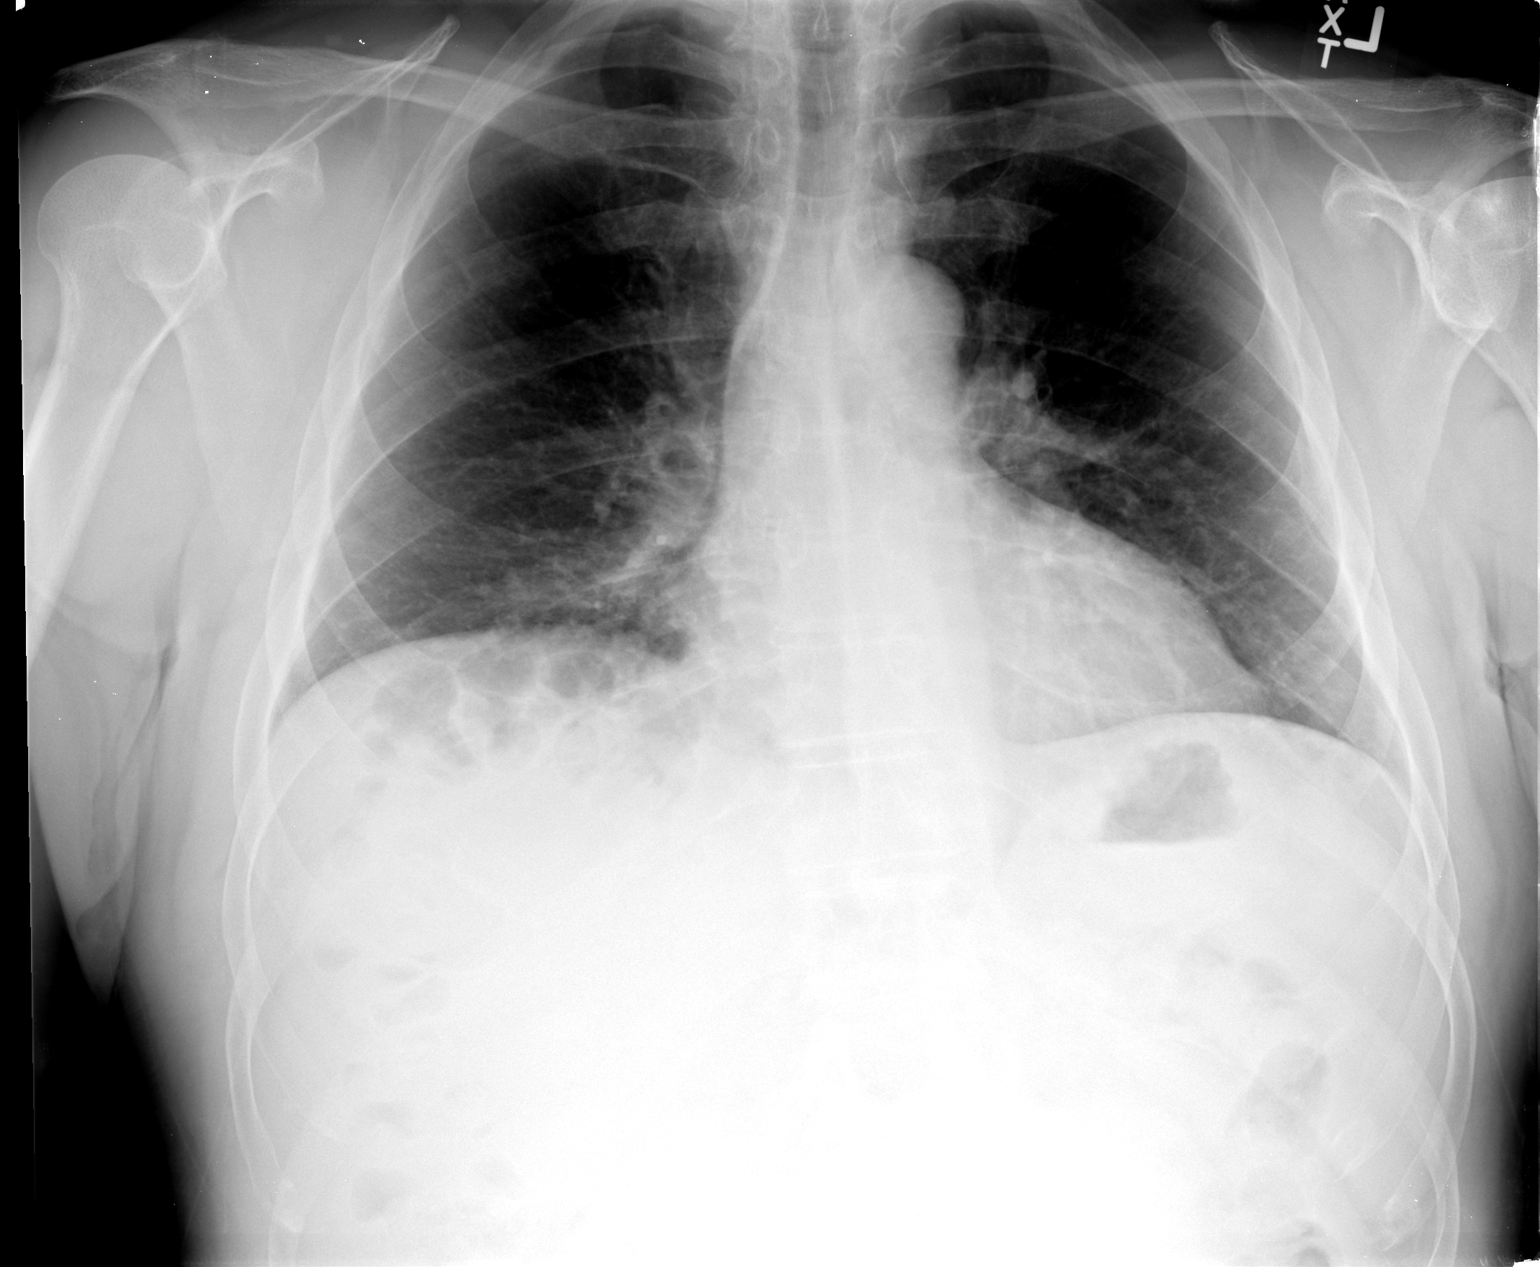

[view not recorded (2 of 2)]
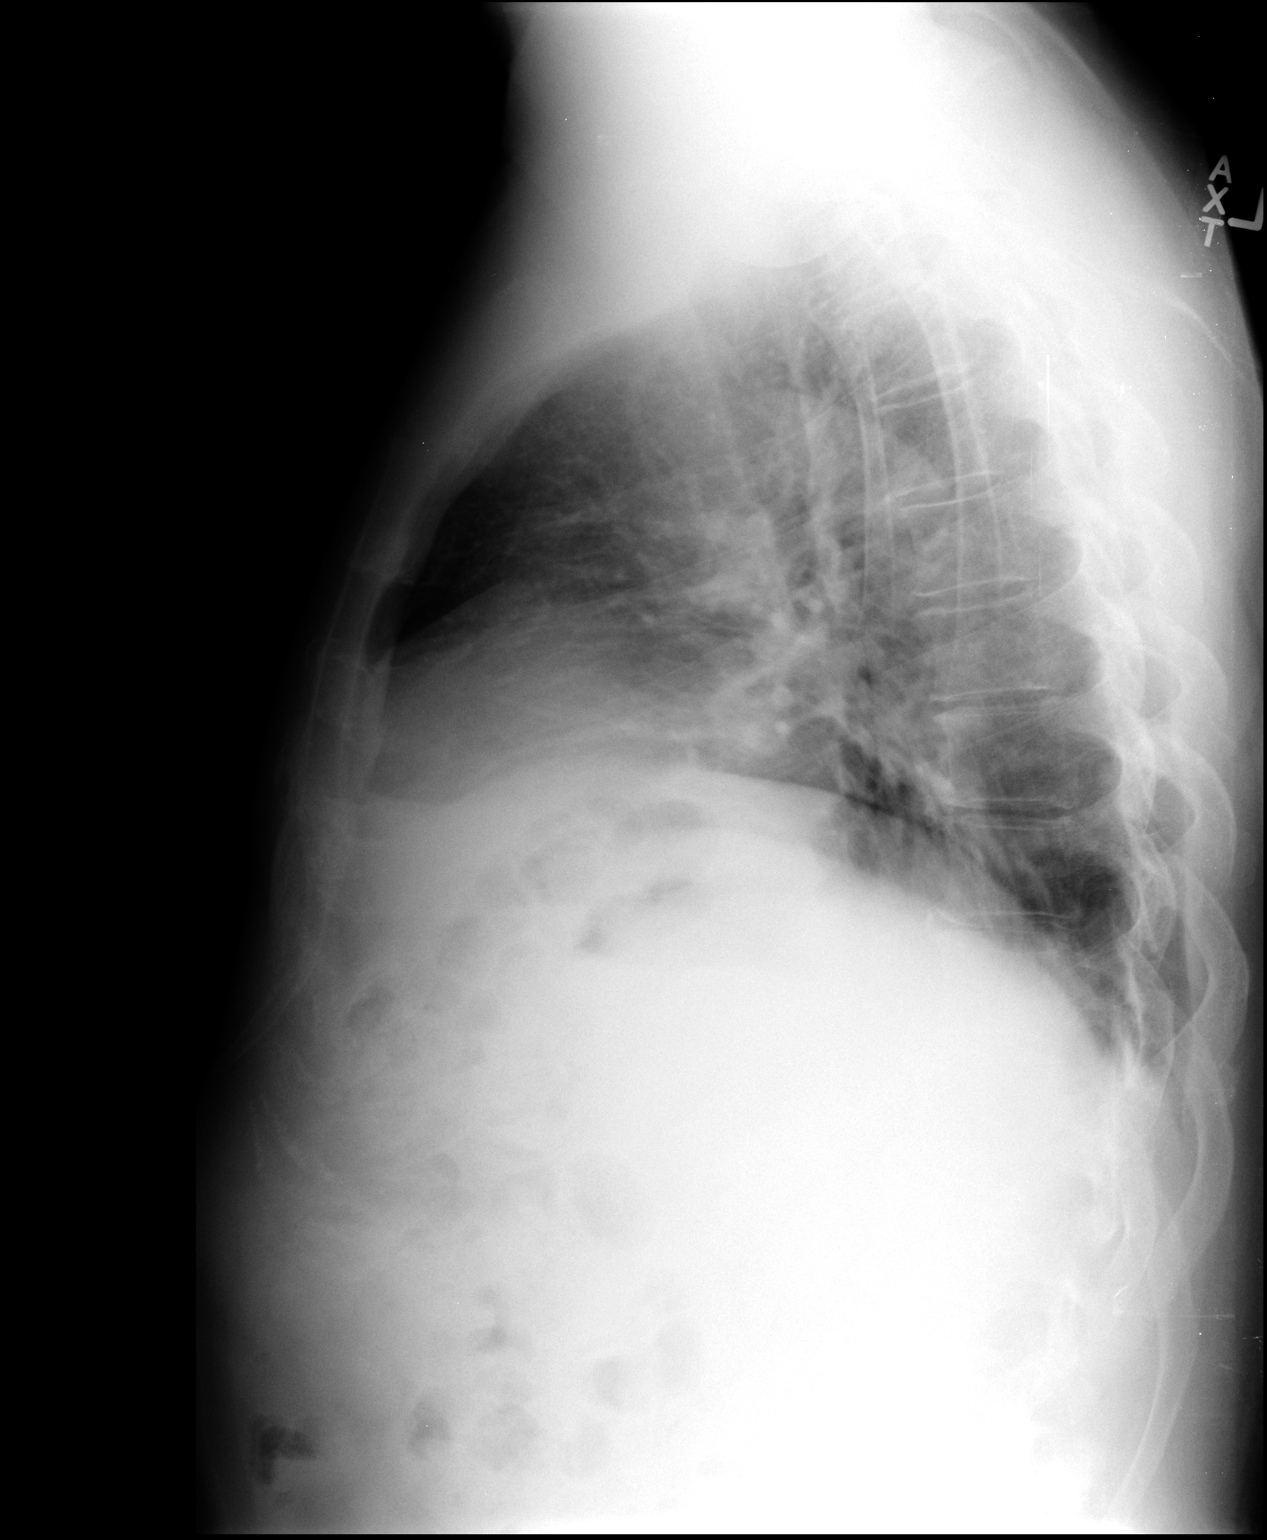

[2 of 2 positions shown; findings below may reference images not displayed]

FINDINGS: The heart, mediastinum and hila are unremarkable.

Lung volumes are low.  There are increased markings in the bases
from bronchovascular crowding. The lungs are otherwise clear.  No
pleural effusion or pneumothorax.  The bony thorax is intact.
IMPRESSION: No acute cardiopulmonary disease.

## 2013-12-07 ENCOUNTER — Encounter: Payer: Self-pay | Admitting: Internal Medicine

## 2014-05-10 ENCOUNTER — Telehealth: Payer: Self-pay | Admitting: Internal Medicine

## 2014-05-10 NOTE — Telephone Encounter (Signed)
At pt.'s last ov he said the vac. Was definitely helping. His last order was 08/03/12. Apparently he stopped taking his shots, he hasn't seen you since then either. D/C?

## 2014-05-10 NOTE — Telephone Encounter (Signed)
Yes, he moved away

## 2014-05-10 NOTE — Telephone Encounter (Signed)
Noted  

## 2014-05-28 ENCOUNTER — Encounter: Payer: Self-pay | Admitting: Internal Medicine
